# Patient Record
Sex: Male | Born: 1975
Health system: Southern US, Community
[De-identification: ages and names within clinical notes are randomized; demographics above are authoritative.]

## PROBLEM LIST (undated history)

## (undated) DIAGNOSIS — F4310 Post-traumatic stress disorder, unspecified: Secondary | ICD-10-CM

## (undated) DIAGNOSIS — K219 Gastro-esophageal reflux disease without esophagitis: Secondary | ICD-10-CM

## (undated) HISTORY — DX: Post-traumatic stress disorder, unspecified: F43.10

## (undated) HISTORY — DX: Gastro-esophageal reflux disease without esophagitis: K21.9

---

## 2002-02-03 ENCOUNTER — Encounter: Payer: Self-pay | Admitting: *Deleted

## 2002-02-04 ENCOUNTER — Encounter: Payer: Self-pay | Admitting: *Deleted

## 2002-02-04 ENCOUNTER — Inpatient Hospital Stay (HOSPITAL_COMMUNITY): Admission: EM | Admit: 2002-02-04 | Discharge: 2002-02-06 | Payer: Self-pay | Admitting: Emergency Medicine

## 2003-05-25 HISTORY — PX: CARDIAC CATHETERIZATION: SHX172

## 2007-04-25 ENCOUNTER — Ambulatory Visit: Payer: Self-pay | Admitting: Family Medicine

## 2010-07-14 ENCOUNTER — Emergency Department: Payer: Self-pay | Admitting: Emergency Medicine

## 2011-02-03 ENCOUNTER — Ambulatory Visit: Payer: Self-pay | Admitting: General Practice

## 2011-02-15 ENCOUNTER — Encounter: Payer: Self-pay | Admitting: General Practice

## 2011-02-22 ENCOUNTER — Encounter: Payer: Self-pay | Admitting: General Practice

## 2011-03-25 ENCOUNTER — Encounter: Payer: Self-pay | Admitting: General Practice

## 2014-01-09 ENCOUNTER — Ambulatory Visit: Payer: Self-pay | Admitting: General Practice

## 2014-01-14 ENCOUNTER — Ambulatory Visit: Payer: Self-pay | Admitting: General Practice

## 2015-07-15 ENCOUNTER — Ambulatory Visit (INDEPENDENT_AMBULATORY_CARE_PROVIDER_SITE_OTHER): Payer: 59 | Admitting: Family Medicine

## 2015-07-15 ENCOUNTER — Encounter: Payer: Self-pay | Admitting: Family Medicine

## 2015-07-15 VITALS — BP 120/80 | HR 88 | Ht 76.0 in | Wt 218.0 lb

## 2015-07-15 DIAGNOSIS — F4310 Post-traumatic stress disorder, unspecified: Secondary | ICD-10-CM | POA: Diagnosis not present

## 2015-07-15 MED ORDER — TRAZODONE HCL 50 MG PO TABS: 25.0000 mg | ORAL_TABLET | Freq: Every evening | ORAL | Status: DC | PRN

## 2015-07-15 MED ORDER — SERTRALINE HCL 50 MG PO TABS: 50.0000 mg | ORAL_TABLET | Freq: Every day | ORAL | Status: DC

## 2015-07-15 MED ORDER — SERTRALINE HCL 100 MG PO TABS: 100.0000 mg | ORAL_TABLET | Freq: Every day | ORAL | Status: DC

## 2015-07-15 NOTE — Progress Notes (Signed)
Name: Dylan Shields   MRN: [id number]    DOB: February 04, 1976   Date:07/15/2015       Progress Note  Subjective  Chief Complaint  Chief Complaint  Patient presents with  . Establish Care  . Depression    needs to get back in with psychiatry    Depression        This is a recurrent problem.  The current episode started more than 1 year ago.   The onset quality is gradual.   The problem occurs daily.  The problem has been gradually worsening since onset.  Associated symptoms include helplessness, insomnia, irritable, decreased interest and sad.  Associated symptoms include no decreased concentration, no fatigue, no hopelessness, no myalgias, no headaches and no suicidal ideas.     The symptoms are aggravated by work stress.  Past treatments include SSRIs - Selective serotonin reuptake inhibitors.  Compliance with prior treatments: off meds.  Past compliance problems include difficulty with treatment plan.  Previous treatment provided moderate relief.  Risk factors include the patient not taking medications correctly.    No problem-specific assessment & plan notes found for this encounter.   Past Medical History  Diagnosis Date  . GERD (gastroesophageal reflux disease)   . Post traumatic stress disorder (PTSD)     Past Surgical History  Procedure Laterality Date  . Cardiac catheterization  2005    clear    Family History  Problem Relation Age of Onset  . Diabetes Father   . Hyperlipidemia Father   . Hypertension Father   . Heart disease Father     Social History   Social History  . Marital Status: Married    Spouse Name: N/A  . Number of Children: N/A  . Years of Education: N/A   Occupational History  . Not on file.   Social History Main Topics  . Smoking status: Never Smoker   . Smokeless tobacco: Current User    Types: Snuff  . Alcohol Use: 0.0 oz/week    0 Standard drinks or equivalent per week  . Drug Use: No  . Sexual Activity: Yes   Other Topics  Concern  . Not on file   Social History Narrative  . No narrative on file    Allergies  Allergen Reactions  . Penicillin G Other (See Comments)    During infancy     Review of Systems  Constitutional: Negative for fever, chills, weight loss, malaise/fatigue and fatigue.  HENT: Negative for ear discharge, ear pain and sore throat.   Eyes: Negative for blurred vision.  Respiratory: Negative for cough, sputum production, shortness of breath and wheezing.   Cardiovascular: Negative for chest pain, palpitations and leg swelling.  Gastrointestinal: Negative for heartburn, nausea, abdominal pain, diarrhea, constipation, blood in stool and melena.  Genitourinary: Negative for dysuria, urgency, frequency and hematuria.  Musculoskeletal: Negative for myalgias, back pain, joint pain and neck pain.  Skin: Negative for rash.  Neurological: Negative for dizziness, tingling, sensory change, focal weakness and headaches.  Endo/Heme/Allergies: Negative for environmental allergies and polydipsia. Does not bruise/bleed easily.  Psychiatric/Behavioral: Positive for depression. Negative for suicidal ideas and decreased concentration. The patient has insomnia. The patient is not nervous/anxious.      Objective  Filed Vitals:   07/15/15 1436  BP: 120/80  Pulse: 88  Height:  (1.93 m)  Weight: 218 lb (98.884 kg)    Physical Exam  Constitutional: He is oriented to person, place, and time and well-developed, well-nourished, and in  no distress. He is irritable.  HENT:  Head: Normocephalic.  Right Ear: External ear normal.  Left Ear: External ear normal.  Nose: Nose normal.  Mouth/Throat: Oropharynx is clear and moist.  Eyes: Conjunctivae and EOM are normal. Pupils are equal, round, and reactive to light. Right eye exhibits no discharge. Left eye exhibits no discharge. No scleral icterus.  Neck: Normal range of motion. Neck supple. No JVD present. No tracheal deviation present. No  thyromegaly present.  Cardiovascular: Normal rate, regular rhythm, normal heart sounds and intact distal pulses.  Exam reveals no gallop and no friction rub.   No murmur heard. Pulmonary/Chest: Breath sounds normal. No respiratory distress. He has no wheezes. He has no rales.  Abdominal: Soft. Bowel sounds are normal. He exhibits no mass. There is no hepatosplenomegaly. There is no tenderness. There is no rebound, no guarding and no CVA tenderness.  Musculoskeletal: Normal range of motion. He exhibits no edema or tenderness.  Lymphadenopathy:    He has no cervical adenopathy.  Neurological: He is alert and oriented to person, place, and time. He has normal sensation, normal strength, normal reflexes and intact cranial nerves. No cranial nerve deficit.  Skin: Skin is warm. No rash noted.  Psychiatric: Mood and affect normal.  Nursing note and vitals reviewed.     Assessment & Plan  Problem List Items Addressed This Visit    None    Visit Diagnoses    Post traumatic stress disorder (PTSD)    -  Primary    Relevant Medications    traZODone (DESYREL) 50 MG tablet    sertraline (ZOLOFT) 50 MG tablet    sertraline (ZOLOFT) 100 MG tablet    Other Relevant Orders    Ambulatory referral to Psychiatry         Dr. Elizabeth Sauer [facility]  07/15/2015

## 2015-07-24 ENCOUNTER — Other Ambulatory Visit: Payer: Self-pay

## 2015-08-12 ENCOUNTER — Ambulatory Visit: Payer: 59 | Admitting: Family Medicine

## 2015-09-01 ENCOUNTER — Other Ambulatory Visit: Payer: Self-pay

## 2015-09-11 ENCOUNTER — Other Ambulatory Visit: Payer: Self-pay | Admitting: Family Medicine

## 2015-11-18 IMAGING — CR DG ABDOMEN 3V
1 series · 3 of 3 positions shown · non-contrast
Comparison: None.

CLINICAL DATA: Abdominal pain

EXAM:
ABDOMEN SERIES

[Series 1: kdxr abdomen 3-way(inc pa cxr) · 0.14mm/px · 3 of 3 slices shown]
[im 1/3]
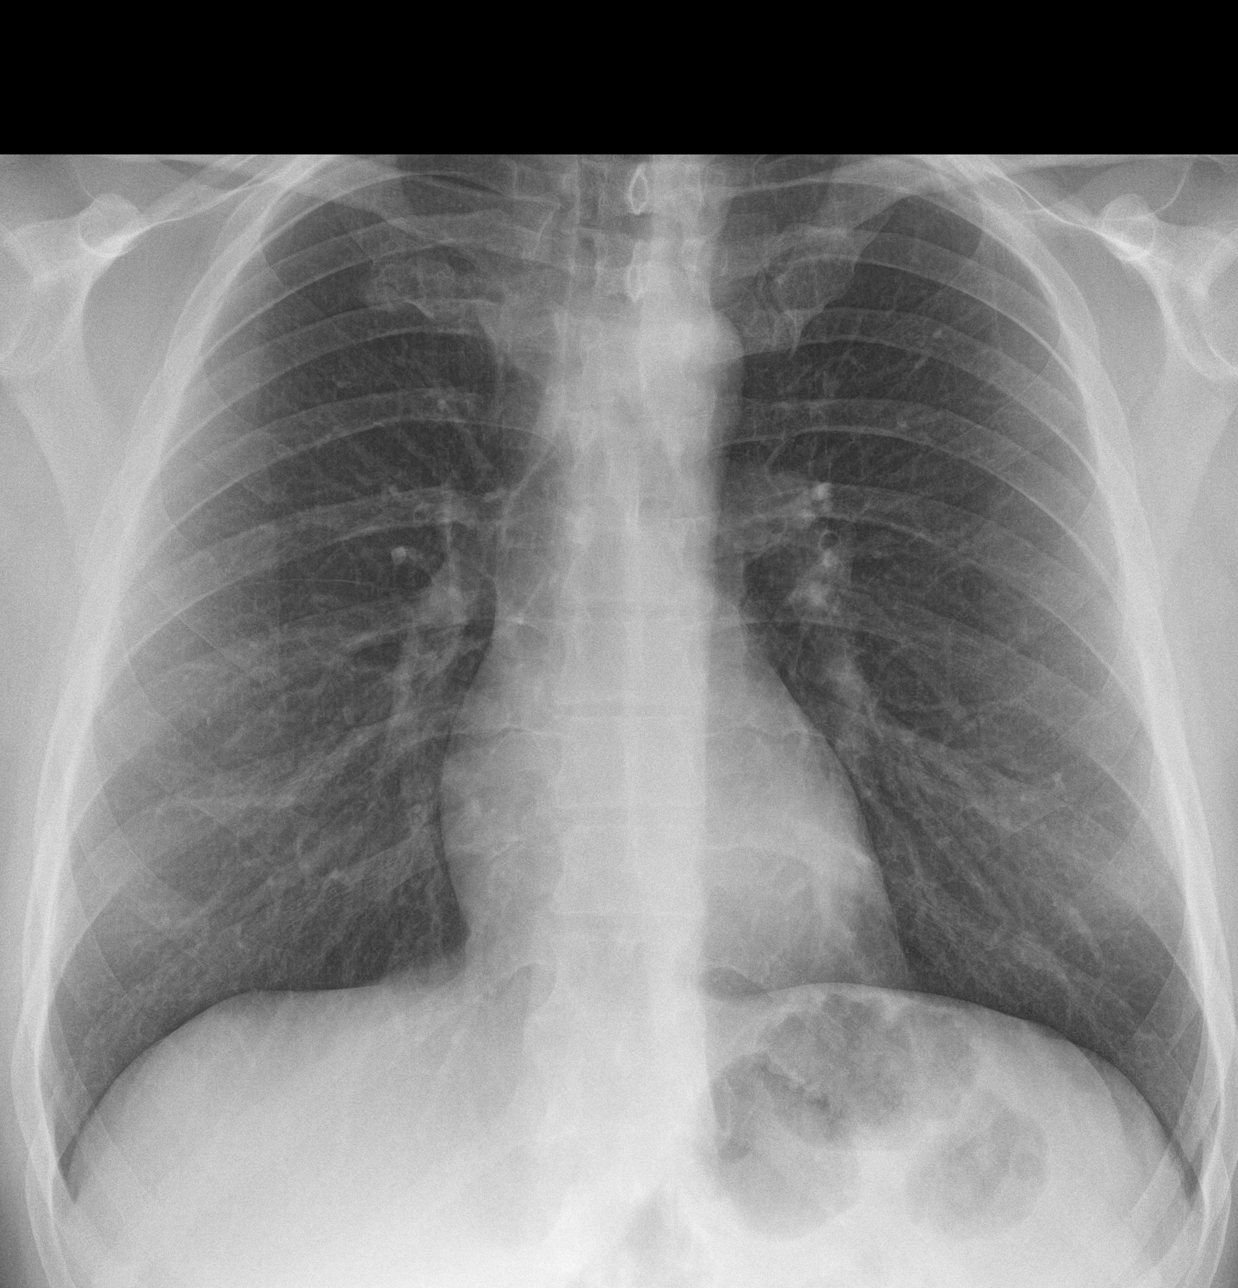
[im 2/3]
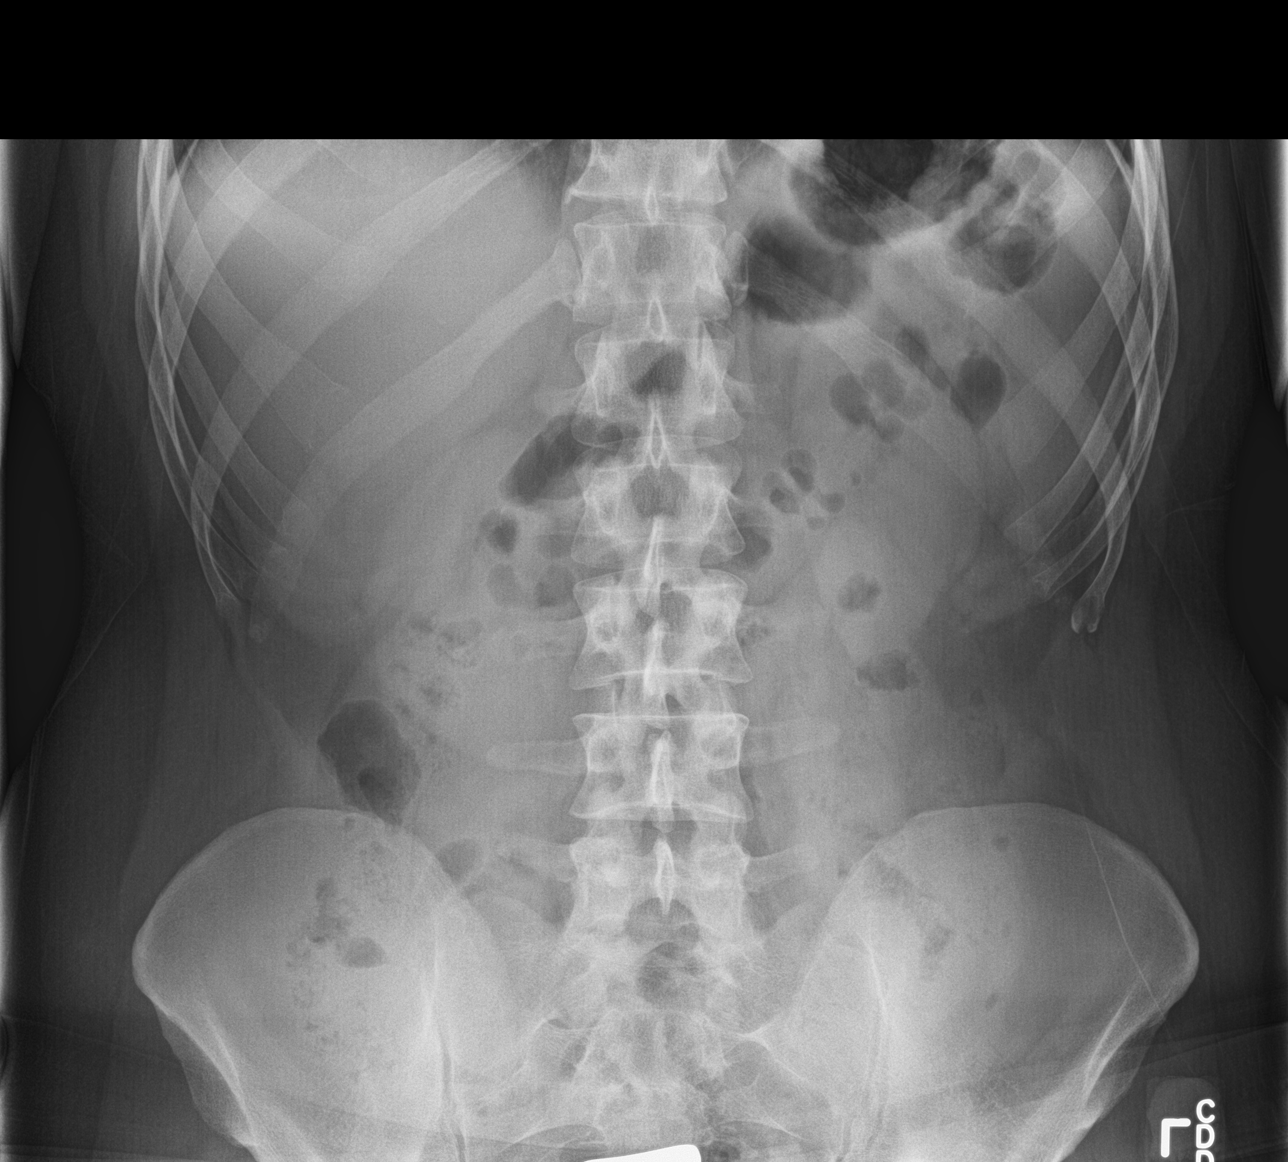
[im 3/3]
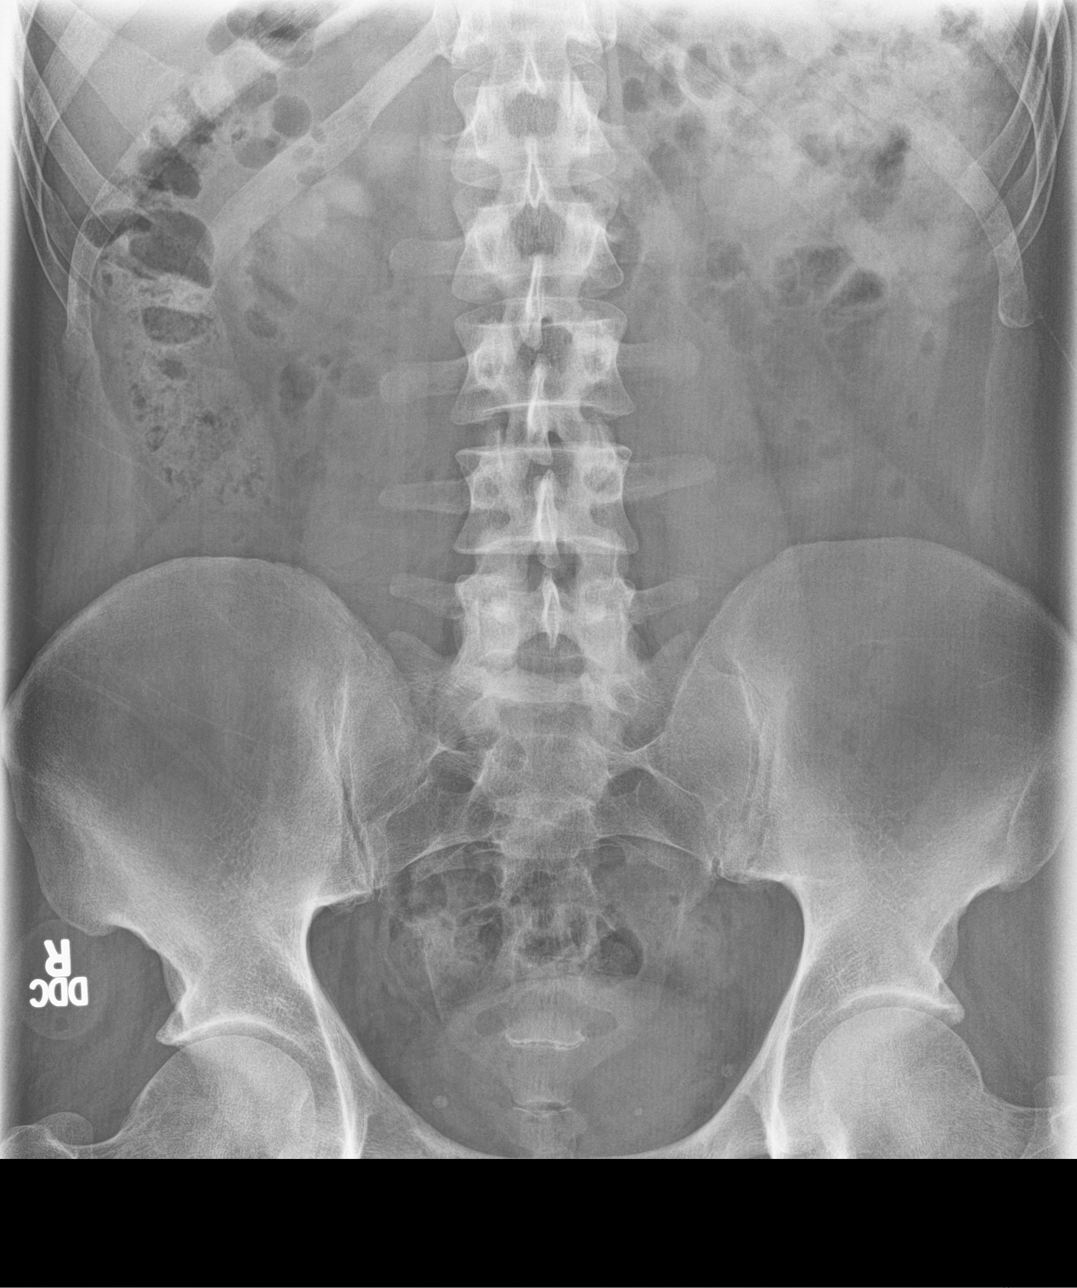

[3 of 3 positions shown; findings below may reference images not displayed]

FINDINGS: PA chest: No edema or consolidation. Heart size and pulmonary
vascularity are normal. No adenopathy.

Supine and upright abdomen: There is moderate stool in the colon.
The bowel gas pattern is unremarkable. No obstruction or free air.
There are phleboliths in the pelvis.
IMPRESSION: Bowel gas pattern unremarkable. Moderate stool in colon. No lung
edema or consolidation.

## 2015-11-23 IMAGING — CT CT ABD-PELV W/ CM
2 of 4 series · 16 of 46 positions shown, 18 images · IV contrast (isovue)
Comparison: Plain films of 01/09/14

CLINICAL DATA: Right upper quadrant and umbilical pain for 3 weeks.
Change in stools. Nausea.

EXAM:
CT ABDOMEN AND PELVIS WITH CONTRAST
TECHNIQUE: Multidetector CT imaging of the abdomen and pelvis was performed
using the standard protocol following bolus administration of
intravenous contrast.
CONTRAST:  100 cc Isovue 370

[Series 2: routine abd pel with · axial · 0.75mm/px · z∈[-961,-516]mm · 13 of 99 slices shown, 15 images]
[im 5/99  soft-tissue]
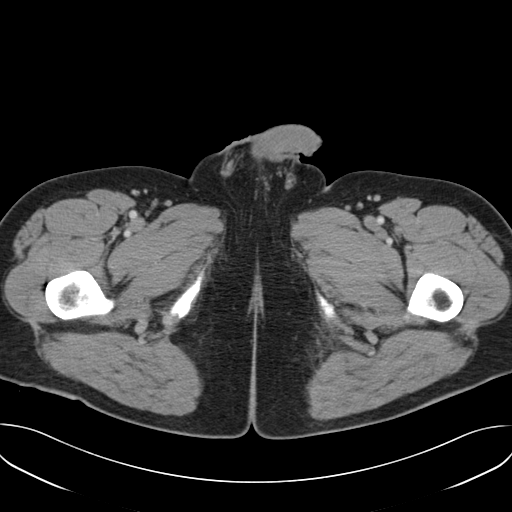
[im 5/99  bone]
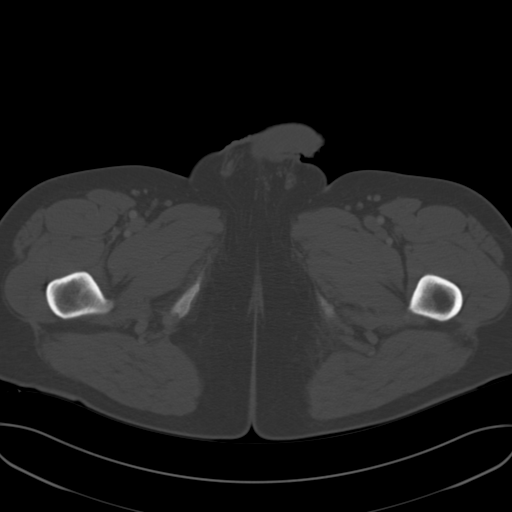
[im 13/99  soft-tissue]
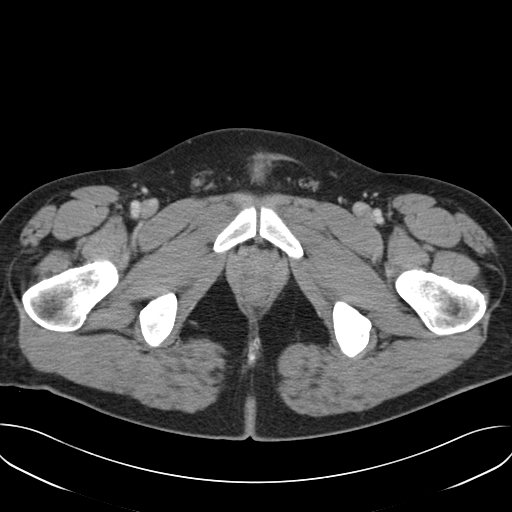
[im 21/99  soft-tissue]
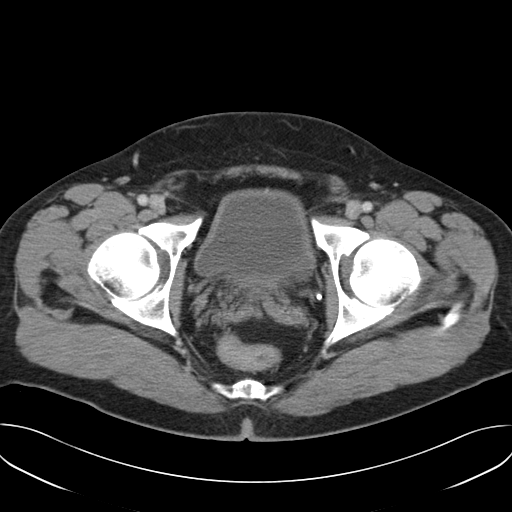
[im 29/99  soft-tissue]
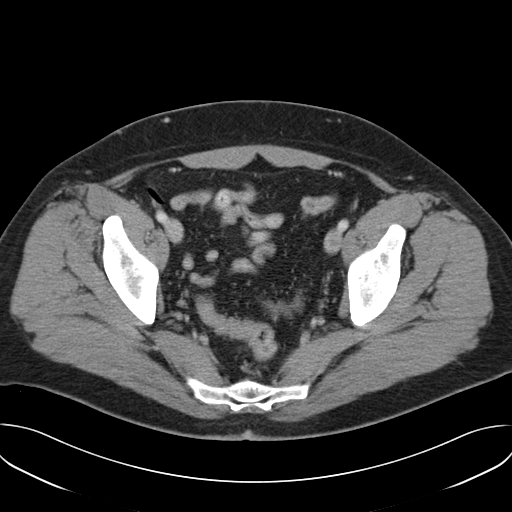
[im 33/99  soft-tissue]
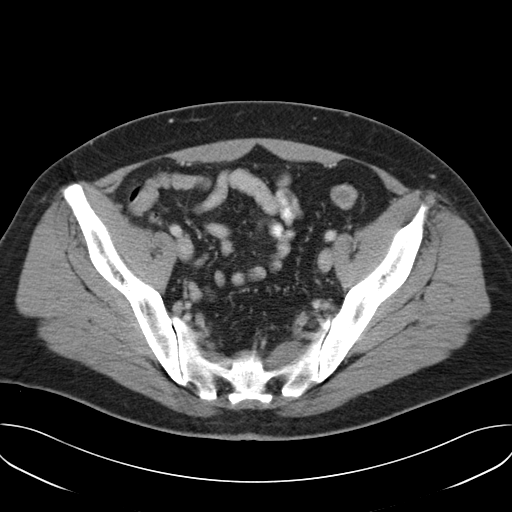
[im 41/99  soft-tissue]
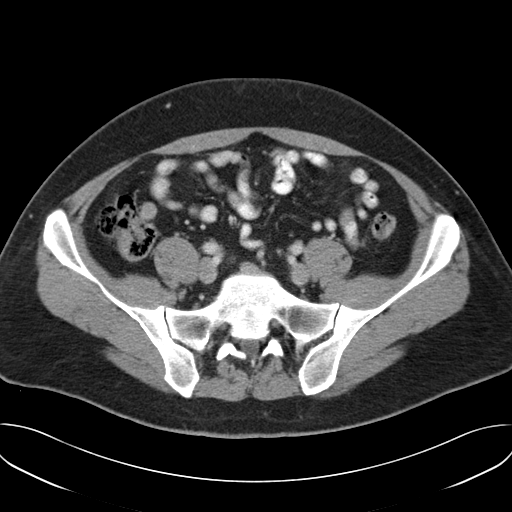
[im 50/99  soft-tissue]
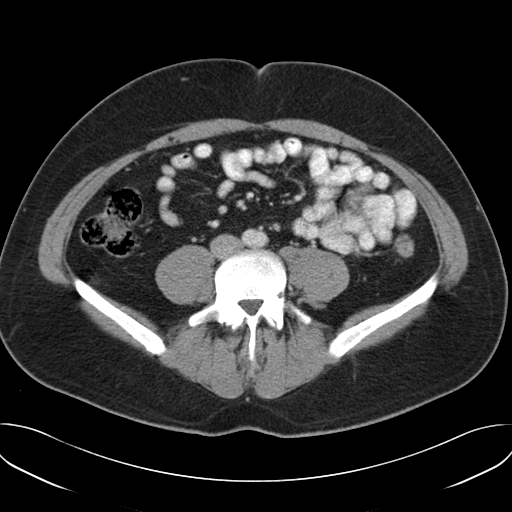
[im 58/99  soft-tissue]
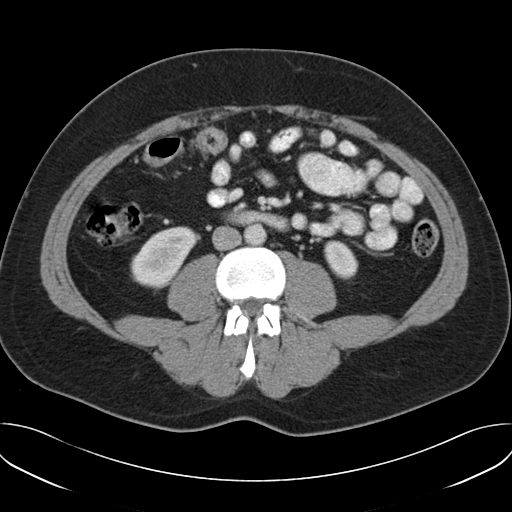
[im 66/99  soft-tissue]
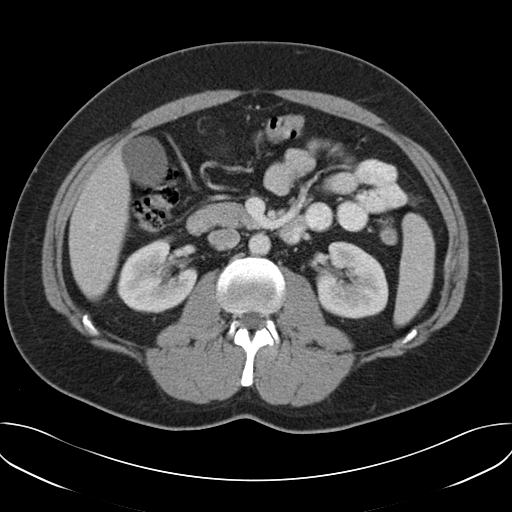
[im 66/99  bone]
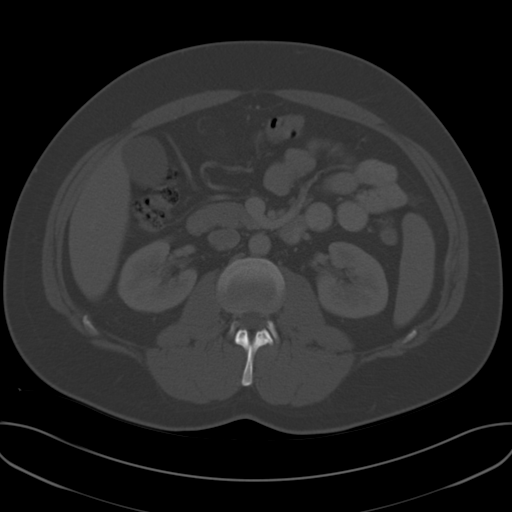
[im 70/99  soft-tissue]
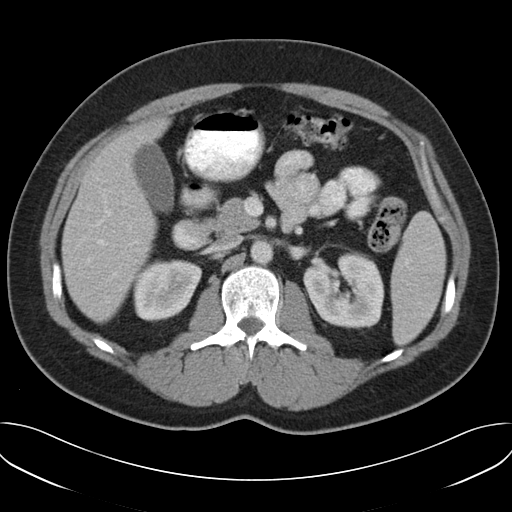
[im 78/99  soft-tissue]
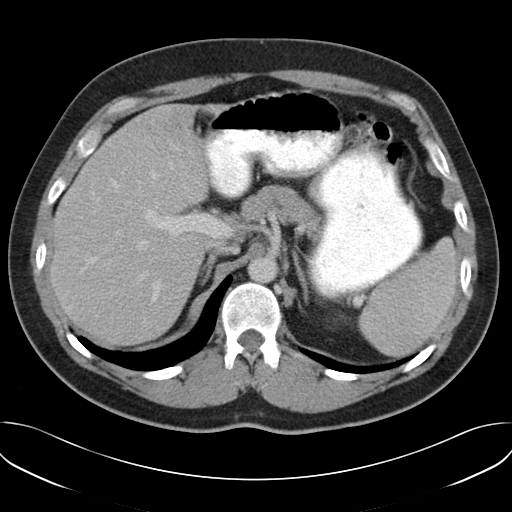
[im 86/99  soft-tissue]
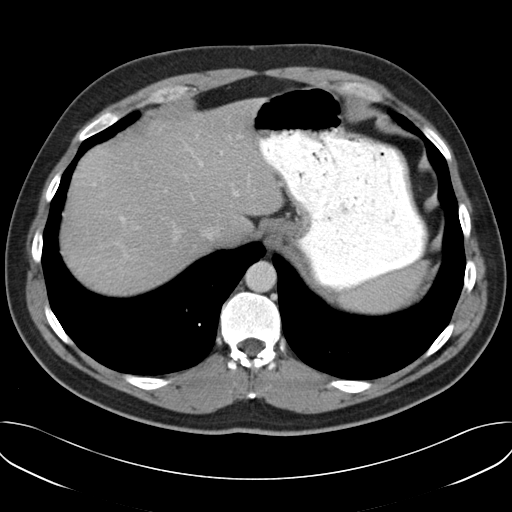
[im 94/99  soft-tissue]
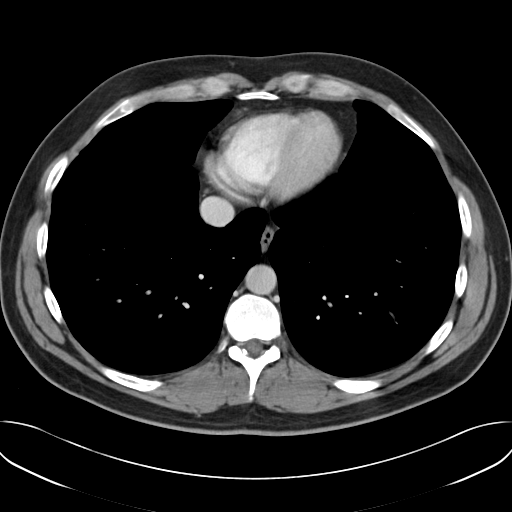

[Series 5: cor routine abd pel with · coronal · 0.77mm/px · 3 of 140 slices shown]
[im 47/140  soft-tissue]
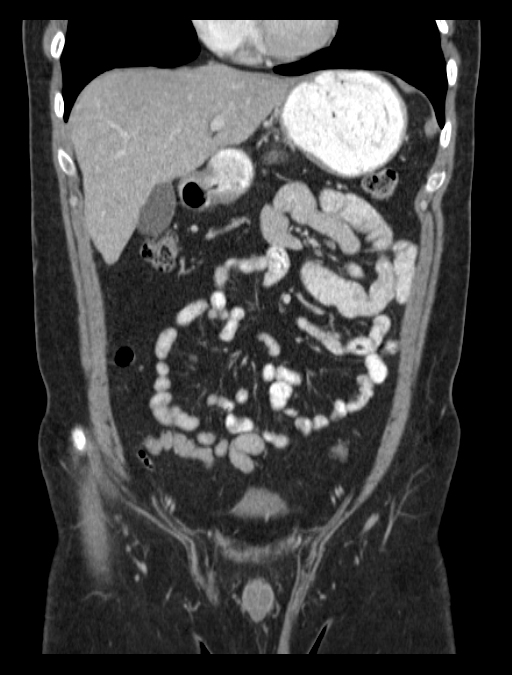
[im 62/140  soft-tissue]
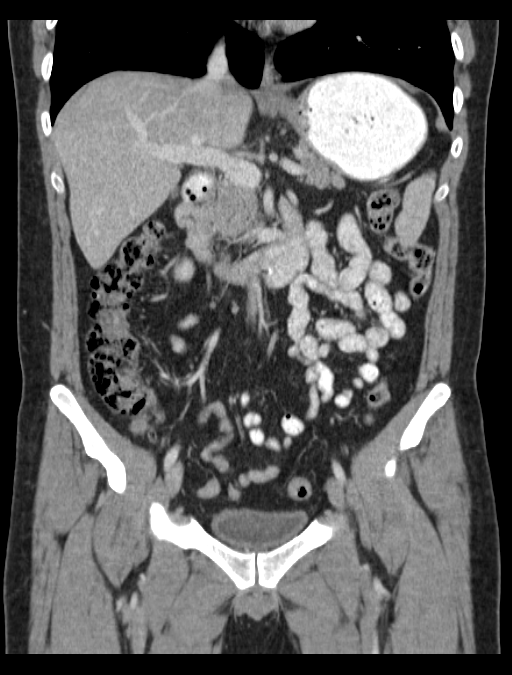
[im 78/140  soft-tissue]
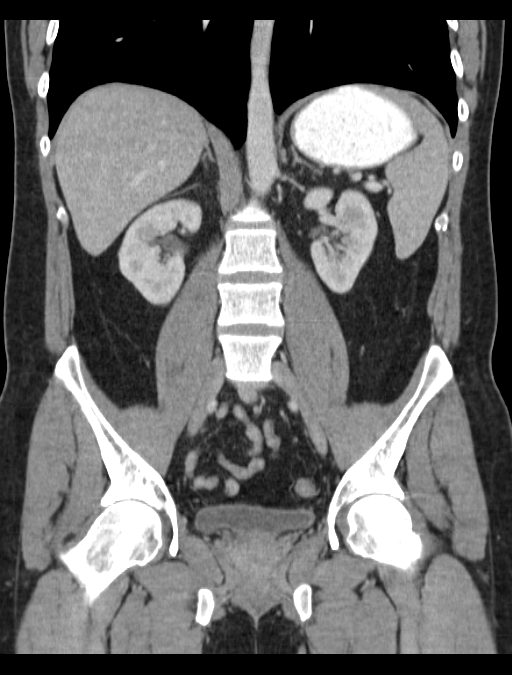

[16 of 46 positions shown; findings below may reference images not displayed]

FINDINGS: Lower chest: Clear lung bases. Normal heart size, without
pericardial or pleural effusion.

Liver: Focal steatosis about the falciform ligament. Suspect diffuse
mild hepatic steatosis.

Spleen: Normal

Stomach: Normal, without wall thickening.

Pancreas: Normal, without mass or pancreatic ductal dilatation.

Gallbladder/Biliary Tree: Normal gallbladder, without intra or
extrahepatic biliary ductal dilatation.

Kidneys/Adrenals: Normal adrenal glands. Normal kidneys, without
hydronephrosis.

Bowel loops: Normal colon, appendix, and terminal ileum. Normal
small bowel without abdominal ascites.

Vascular: Normal

Nodes: No retroperitoneal or retrocrural adenopathy. No pelvic
adenopathy.

Pelvic Genitourinary: Normal urinary bladder and prostate. No
significant free fluid.

Other: None

Bones/Musculoskeletal: No focal osseous abnormality.
IMPRESSION: 1.  No acute process or explanation for abdominal pain.
2. Suspicion of mild hepatic steatosis with more focal steatosis
adjacent the falciform ligament.

## 2016-08-01 ENCOUNTER — Encounter: Payer: Self-pay | Admitting: Emergency Medicine

## 2016-08-01 ENCOUNTER — Emergency Department
Admission: EM | Admit: 2016-08-01 | Discharge: 2016-08-03 | Disposition: A | Discharge status: Other Acute Inpatient Hospital | Payer: Self-pay | Attending: Emergency Medicine | Admitting: Emergency Medicine

## 2016-08-01 DIAGNOSIS — Z79899 Other long term (current) drug therapy: Secondary | ICD-10-CM | POA: Insufficient documentation

## 2016-08-01 DIAGNOSIS — F332 Major depressive disorder, recurrent severe without psychotic features: Secondary | ICD-10-CM | POA: Insufficient documentation

## 2016-08-01 DIAGNOSIS — F17290 Nicotine dependence, other tobacco product, uncomplicated: Secondary | ICD-10-CM | POA: Insufficient documentation

## 2016-08-01 DIAGNOSIS — F10129 Alcohol abuse with intoxication, unspecified: Secondary | ICD-10-CM | POA: Insufficient documentation

## 2016-08-01 DIAGNOSIS — F10929 Alcohol use, unspecified with intoxication, unspecified: Secondary | ICD-10-CM

## 2016-08-01 DIAGNOSIS — R45851 Suicidal ideations: Secondary | ICD-10-CM

## 2016-08-01 LAB — COMPREHENSIVE METABOLIC PANEL
ALBUMIN: 4.6 g/dL (ref 3.5–5.0)
ALT: 18 U/L (ref 17–63)
AST: 25 U/L (ref 15–41)
Alkaline Phosphatase: 73 U/L (ref 38–126)
Anion gap: 9 (ref 5–15)
BUN: 13 mg/dL (ref 6–20)
CHLORIDE: 110 mmol/L (ref 101–111)
CO2: 22 mmol/L (ref 22–32)
CREATININE: 0.85 mg/dL (ref 0.61–1.24)
Calcium: 9 mg/dL (ref 8.9–10.3)
GFR calc Af Amer: >60 mL/min (ref >60)
GFR calc non Af Amer: >60 mL/min (ref >60)
Glucose, Bld: 91 mg/dL (ref 65–99)
POTASSIUM: 3.9 mmol/L (ref 3.5–5.1)
SODIUM: 141 mmol/L (ref 135–145)
Total Bilirubin: 0.6 mg/dL (ref 0.3–1.2)
Total Protein: 7.6 g/dL (ref 6.5–8.1)

## 2016-08-01 LAB — CBC
HCT: 41.1 % (ref 40.0–52.0)
HEMOGLOBIN: 14.4 g/dL (ref 13.0–18.0)
MCH: 30.9 pg (ref 26.0–34.0)
MCHC: 34.9 g/dL (ref 32.0–36.0)
MCV: 88.6 fL (ref 80.0–100.0)
Platelets: 238 10*3/uL (ref 150–440)
RBC: 4.64 MIL/uL (ref 4.40–5.90)
RDW: 13.5 % (ref 11.5–14.5)
WBC: 7.2 10*3/uL (ref 3.8–10.6)

## 2016-08-01 LAB — URINE DRUG SCREEN, QUALITATIVE (ARMC ONLY)
Amphetamines, Ur Screen: NOT DETECTED
BARBITURATES, UR SCREEN: NOT DETECTED
BENZODIAZEPINE, UR SCRN: NOT DETECTED
Cannabinoid 50 Ng, Ur ~~LOC~~: NOT DETECTED
Cocaine Metabolite,Ur ~~LOC~~: NOT DETECTED
MDMA (Ecstasy)Ur Screen: NOT DETECTED
METHADONE SCREEN, URINE: NOT DETECTED
Opiate, Ur Screen: NOT DETECTED
Phencyclidine (PCP) Ur S: NOT DETECTED
TRICYCLIC, UR SCREEN: NOT DETECTED

## 2016-08-01 LAB — ACETAMINOPHEN LEVEL: Acetaminophen (Tylenol), Serum: <10 ug/mL — ABNORMAL LOW (ref 10–30)

## 2016-08-01 LAB — SALICYLATE LEVEL: Salicylate Lvl: <7 mg/dL (ref 2.8–30.0)

## 2016-08-01 LAB — ETHANOL: Alcohol, Ethyl (B): 214 mg/dL — ABNORMAL HIGH (ref <5)

## 2016-08-01 MED ORDER — SERTRALINE HCL 100 MG PO TABS
100.0000 mg | ORAL_TABLET | Freq: Every day | ORAL | Status: DC
Start: 2016-08-01 — End: 2016-08-03
  Administered 2016-08-01 – 2016-08-03 (×3): 100 mg via ORAL
  Filled 2016-08-01 (×2): qty 1
  Filled 2016-08-01: qty 2

## 2016-08-01 MED ORDER — TRAZODONE HCL 50 MG PO TABS
50.0000 mg | ORAL_TABLET | Freq: Every day | ORAL | Status: DC
  Administered 2016-08-01 – 2016-08-02 (×2): 50 mg via ORAL
  Filled 2016-08-01 (×2): qty 1

## 2016-08-01 NOTE — ED Notes (Signed)
Pt transferred from main ed on ivc awaiting in patient adm , he is in no distress and offers no requests or c/o at this time

## 2016-08-01 NOTE — ED Notes (Addendum)
Pt to bhu2. Dr aware

## 2016-08-01 NOTE — ED Notes (Signed)
Pt reports increasing sx of depression over the last few weeks. Pt reports increasing thoughts of self harm. Pt repots he and his wife split up several days ago and he is living with his parents. Pt contracts for safety with this RN at this time.

## 2016-08-01 NOTE — ED Provider Notes (Signed)
[facility] Emergency Department Provider Note  ____________________________________________  Time seen: Approximately 4:48 AM  I have reviewed the triage vital signs and the nursing notes.   HISTORY  Chief Complaint Suicidal    HPI Dylan Shields is a 41 y.o. male with a history of PTSD and depression, currently on Zoloft and trazodone, reporting compliance with medications, states he is feeling suicidal. He wants to kill himself and his wife. 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 States that he feels like his medications aren't working. Also a month ago his father-in-law died and since then he feels like his wife has been very short tempered and he has not been able to interact with her in any positive way. States he is tired of living and just wants it to be over. Denies hallucinations.  No acute medical complaints.     Past Medical History:  Diagnosis Date  . GERD (gastroesophageal reflux disease)   . Post traumatic stress disorder (PTSD)      There are no active problems to display for this patient.    Past Surgical History:  Procedure Laterality Date  . CARDIAC CATHETERIZATION  2005   clear     Prior to Admission medications   Medication Sig Start Date End Date Taking? Authorizing Provider  Multiple Vitamin (MULTIVITAMIN) capsule Take 1 capsule by mouth daily.   Yes Historical Provider, MD  sertraline (ZOLOFT) 100 MG tablet Take 1 tablet (100 mg total) by mouth daily. Taken 150mg  total 07/15/15  Yes Duanne Limerick, MD  sertraline (ZOLOFT) 50 MG tablet Take 1 tablet (50 mg total) by mouth daily. 07/15/15  Yes Duanne Limerick, MD  traZODone (DESYREL) 50 MG tablet Take 0.5-1 tablets (25-50 mg total) by mouth at bedtime as needed for sleep. 07/15/15  Yes Duanne Limerick, MD     Allergies Penicillin g   Family History  Problem Relation Age of Onset  . Diabetes Father   . Hyperlipidemia  Father   . Hypertension Father   . Heart disease Father     Social History Social History  Substance Use Topics  . Smoking status: Never Smoker  . Smokeless tobacco: Current User    Types: Snuff  . Alcohol use 0.0 oz/week    Review of Systems  Constitutional:   No fever or chills.  ENT:   No sore throat. No rhinorrhea. Cardiovascular:   No chest pain. Respiratory:   No dyspnea or cough. Gastrointestinal:   Negative for abdominal pain, vomiting and diarrhea.  Genitourinary:   Negative for dysuria or difficulty urinating. Musculoskeletal:   Negative for focal pain or swelling Neurological:   Negative for headaches 10-point ROS otherwise negative.  ____________________________________________   PHYSICAL EXAM:  VITAL SIGNS: ED Triage Vitals  Enc Vitals Group     BP 08/01/16 0319 137/84     Pulse Rate 08/01/16 0319 (!) 102     Resp 08/01/16 0319 18     Temp 08/01/16 0319 98.1 F (36.7 C)     Temp Source 08/01/16 0319 Oral     SpO2 08/01/16 0319 97 %     Weight 08/01/16 0318 205 lb (93 kg)     Height 08/01/16 0318 6\' 4"  (1.93 m)     Head Circumference --      Peak Flow --      Pain Score 08/01/16 0317 0     Pain Loc --  Pain Edu? --      Excl. in GC? --     Vital signs reviewed, nursing assessments reviewed.   Constitutional:   Alert and oriented. Well appearing and in no distress. Eyes:   No scleral icterus. No conjunctival pallor. PERRL. EOMI.  No nystagmus. ENT   Head:   Normocephalic and atraumatic.   Nose:   No congestion/rhinnorhea. No septal hematoma   Mouth/Throat:   MMM, no pharyngeal erythema. No peritonsillar mass.    Neck:   No stridor. No SubQ emphysema. No meningismus. Hematological/Lymphatic/Immunilogical:   No cervical lymphadenopathy. Cardiovascular:   RRR. Symmetric bilateral radial and DP pulses.  No murmurs.  Respiratory:   Normal respiratory effort without tachypnea nor retractions. Breath sounds are clear and equal  bilaterally. No wheezes/rales/rhonchi. Gastrointestinal:   Soft and nontender. Non distended. There is no CVA tenderness.  No rebound, rigidity, or guarding. Genitourinary:   deferred Musculoskeletal:   Normal range of motion in all extremities. No joint effusions.  No lower extremity tenderness.  No edema. Neurologic:   Normal speech and language.  CN 2-10 normal. Motor grossly intact. No gross focal neurologic deficits are appreciated.  Skin:    Skin is warm, dry and intact. No rash noted.  No petechiae, purpura, or bullae.  ____________________________________________    LABS (pertinent positives/negatives) (all labs ordered are listed, but only abnormal results are displayed) Labs Reviewed  ETHANOL - Abnormal; Notable for the following:       Result Value   Alcohol, Ethyl (B) 214 (*)    All other components within normal limits  ACETAMINOPHEN LEVEL - Abnormal; Notable for the following:    Acetaminophen (Tylenol), Serum <10 (*)    All other components within normal limits  COMPREHENSIVE METABOLIC PANEL  SALICYLATE LEVEL  CBC  URINE DRUG SCREEN, QUALITATIVE ([facility] ONLY)   ____________________________________________   EKG    ____________________________________________    RADIOLOGY  No results found.  ____________________________________________   PROCEDURES Procedures  ____________________________________________   INITIAL IMPRESSION / ASSESSMENT AND PLAN / ED COURSE  Pertinent labs & imaging results that were available during my care of the patient were reviewed by me and considered in my medical decision making (see chart for details).  Patient well appearing no acute distress, presents with SI and HI. Patient is comfortable with guns and has lots of them in his house according to his report. Appears to be decompensated depression. Have initiated involuntary commitment petition. We'll retain the patient in the ED pending psychiatric evaluation. I expect  the patient will be planned for inpatient management as he appears to be high risk for harm to himself or others.         ____________________________________________   FINAL CLINICAL IMPRESSION(S) / ED DIAGNOSES  Final diagnoses:  Alcoholic intoxication with complication (HCC)  Suicidal ideation  Severe episode of recurrent major depressive disorder, without psychotic features (HCC)      New Prescriptions   No medications on file     Portions of this note were generated with dragon dictation software. Dictation errors may occur despite best attempts at proofreading.    Sharman CheekPhillip Jaidon Ellery, MD 08/01/16 [phone]

## 2016-08-01 NOTE — ED Notes (Signed)
PT IVC/ PENDING PLACEMENT  

## 2016-08-01 NOTE — ED Notes (Signed)
Pt is taking a shower. Linens changed and trash removed from room. 

## 2016-08-01 NOTE — BH Assessment (Signed)
Referral information for Psychiatric Hospitalization faxed to;   [facility] ([phone] or [phone])  - Per Sharon they do have open beds.  Writer faxed referral to [phone]  [facility] ([phone]), - Per Amanda, they do have open beds.  Writer faxed referral to  [phone]   [facility] ([phone]), -  Per Noel there are open beds.  Writer faxed referral to [phone]  [facility] ([phone]) - Per Shannon, they have one adult male bed.  Writer faxed referral to: [phone]  [facility] ([phone]), - Per Erica - no open today; however, a referral can be sent due to open beds tomorrow.  Writer faxed referral to [phone]   [facility] ([phone]), - Per Jessica no open today; however, a referral can be sent due to open beds tomorrow. Writer faxed referral to [phone]    The following facilities do not have any open beds [facility] ([phone]), Per [facility] - no open beds. [facility] ([phone]), - Per Jonathan - no open beds.     The following facilities did not answer the phone but referrals were faxed to the facility.   [facility] ([phone] or [phone] - Voicemail - referral faxed.        [facility] ([phone]), - No answer writer faxed referral    [facility] ([phone] or [phone]), - No answer writer faxed referral    [facility] ([phone]), - No answer writer faxed referral    [facility] ([phone]) - No answer writer faxed referral     [facility] ([phone]), - No answer writer faxed referral     

## 2016-08-01 NOTE — ED Notes (Signed)
BEHAVIORAL HEALTH ROUNDING  Patient sleeping: No.  Patient alert and oriented: yes  Behavior appropriate: Yes. ; If no, describe:  Nutrition and fluids offered: Yes  Toileting and hygiene offered: Yes  Sitter present: not applicable, Q 15 min safety rounds and observation.  Law enforcement present: Yes ODS  

## 2016-08-01 NOTE — ED Notes (Signed)

## 2016-08-01 NOTE — ED Notes (Signed)
Report was received from Verdis Fredericksonuth B., RN; Pt. Verbalizes  complaints of having Depression and PTSD; distress; verbalizes having S.I.; denies having Hi. Continue to monitor with 15 min. Monitoring.

## 2016-08-01 NOTE — ED Notes (Signed)
Pt was given a cup of water, RN notified

## 2016-08-01 NOTE — ED Notes (Signed)

## 2016-08-01 NOTE — ED Notes (Signed)
Pt given two warm blankets. Pt cold with no blankets given at arrival. Pt breakfast tray placed at bedside, pt stated "not hungry".

## 2016-08-01 NOTE — BH Assessment (Signed)
Tele Assessment Note   Dylan Shields is an 41 y.o. male. Presenting voluntarily to ED for assessment. Pt transported to ED via law enforcement after voicing SI to mother. Pt has h/o PTSD and depression. Pt reports continued SI with plan, intent, and access to shoot himself. Pt previously employed as Radiographer, therapeutic). Pt reports HI with plan and access to harm wife. Pt does not verbalize plan details but states plan to "get it done". Pt reports thoughts of killing himself and "her (wife) if she tries to stop me and anybody who gets in my way". Pt denies current stressors and identifies his parents as supports. Pt does report concerns with medication effectiveness. Pt denies h/o violence and aggression. Pt denies h/o suicide attempt and inpatient admissions. Pt not currently followed by any psychiatric providers. Pt denies hallucinations. Pt does not appear to be responding to internal stimuli or experiencing delusional thought content.   Diagnosis:PTSD MDD  Past Medical History:  Past Medical History:  Diagnosis Date  . GERD (gastroesophageal reflux disease)   . Post traumatic stress disorder (PTSD)     Past Surgical History:  Procedure Laterality Date  . CARDIAC CATHETERIZATION  2005   clear    Family History:  Family History  Problem Relation Age of Onset  . Diabetes Father   . Hyperlipidemia Father   . Hypertension Father   . Heart disease Father     Social History:  reports that he has never smoked. His smokeless tobacco use includes Snuff. He reports that he drinks alcohol. He reports that he does not use drugs.  Additional Social History:  Alcohol / Drug Use Pain Medications: Pt denies abuse. Pt reports medication compliance however, voices concerns regarding effectiveness. Prescriptions: Pt denies abuse. Over the Counter: Pt denies abuse. History of alcohol / drug use?: No history of alcohol / drug abuse  CIWA: CIWA-Ar BP: 137/84 Pulse Rate: (!)  102 COWS:    PATIENT STRENGTHS: (choose at least two) Average or above average intelligence Communication skills Physical Health  Allergies:  Allergies  Allergen Reactions  . Penicillin G Other (See Comments)    During infancy    Home Medications:  (Not in a hospital admission)  OB/GYN Status:  No LMP for male patient.  General Assessment Data Location of Assessment: [facility] ED TTS Assessment: In system Is this a Tele or Face-to-Face Assessment?: Tele Assessment Is this an Initial Assessment or a Re-assessment for this encounter?: Initial Assessment Marital status: Married Is patient pregnant?: No Pregnancy Status: No Living Arrangements: Spouse/significant other Can pt return to current living arrangement?: Yes Admission Status: Voluntary Is patient capable of signing voluntary admission?: Yes Referral Source: Self/Family/Friend Insurance type: Self-Pay     Crisis Care Plan Living Arrangements: Spouse/significant other Name of Psychiatrist: None Name of Therapist: None  Education Status Is patient currently in school?: No Highest grade of school patient has completed: College  Risk to self with the past 6 months Suicidal Ideation: Yes-Currently Present Has patient been a risk to self within the past 6 months prior to admission? : No Suicidal Intent: Yes-Currently Present Has patient had any suicidal intent within the past 6 months prior to admission? : No Is patient at risk for suicide?: Yes Suicidal Plan?: Yes-Currently Present Has patient had any suicidal plan within the past 6 months prior to admission? : No Specify Current Suicidal Plan: Shoot self Access to Means: Yes Specify Access to Suicidal Means: Access to firearms What has been your use of  drugs/alcohol within the last 12 months?: Pt reports alcohol consumption 1-2x/week Previous Attempts/Gestures: No Other Self Harm Risks: n Intentional Self Injurious Behavior: None Family Suicide History:  No Recent stressful life event(s):  (Pt denies any current stressors) Persecutory voices/beliefs?: No Depression: Yes Depression Symptoms: Insomnia, Tearfulness, Feeling angry/irritable, Feeling worthless/self pity, Fatigue, Isolating, Despondent, Loss of interest in usual pleasures Substance abuse history and/or treatment for substance abuse?: No Suicide prevention information given to non-admitted patients: Not applicable  Risk to Others within the past 6 months Homicidal Ideation: Yes-Currently Present Does patient have any lifetime risk of violence toward others beyond the six months prior to admission? : No Thoughts of Harm to Others: Yes-Currently Present Comment - Thoughts of Harm to Others: Pt reports thougts of harming wife "if she tries to stop me and anyone else who gets in my way" Current Homicidal Intent: No Current Homicidal Plan: Yes-Currently Present Describe Current Homicidal Plan: Pt would not clearly identify  Access to Homicidal Means: Yes Describe Access to Homicidal Means: Pt reports access to firearms History of harm to others?: No Does patient have access to weapons?: Yes (Comment) Criminal Charges Pending?: No Does patient have a court date: No Is patient on probation?: No  Psychosis Hallucinations: None noted Delusions: None noted  Mental Status Report Appearance/Hygiene: In scrubs Eye Contact: Fair Motor Activity: Freedom of movement Speech: Soft Level of Consciousness: Alert Mood: Depressed Anxiety Level: None Thought Processes: Coherent, Relevant Judgement: Unimpaired Orientation: Person, Place, Time, Situation Obsessive Compulsive Thoughts/Behaviors: None  Cognitive Functioning Concentration: Normal Memory: Recent Intact, Remote Intact IQ: Average Insight: Good Impulse Control: Fair Appetite: Fair Weight Loss: 20 (w/in 8 months) Weight Gain: 0 Sleep: No Change Total Hours of Sleep: 3  ADLScreening [facility] Assessment Services) Patient's  cognitive ability adequate to safely complete daily activities?: Yes Patient able to express need for assistance with ADLs?: Yes Independently performs ADLs?: Yes (appropriate for developmental age)  Prior Inpatient Therapy Prior Inpatient Therapy: No  Prior Outpatient Therapy Prior Outpatient Therapy: Yes Prior Therapy Dates: 3 Years ago Prior Therapy Facilty/Provider(s): Dr.Kapur Reason for Treatment: Depression, PTSD Does patient have an ACCT team?: No Does patient have Intensive In-House Services?  : No Does patient have [facility] services? : No Does patient have P4CC services?: No  ADL Screening (condition at time of admission) Patient's cognitive ability adequate to safely complete daily activities?: Yes Is the patient deaf or have difficulty hearing?: No Does the patient have difficulty concentrating, remembering, or making decisions?: No Patient able to express need for assistance with ADLs?: Yes Does the patient have difficulty dressing or bathing?: No Independently performs ADLs?: Yes (appropriate for developmental age) Does the patient have difficulty walking or climbing stairs?: No Weakness of Legs: None Weakness of Arms/Hands: None  Home Assistive Devices/Equipment Home Assistive Devices/Equipment: None  Therapy Consults (therapy consults require a physician order) PT Evaluation Needed: No OT Evalulation Needed: No SLP Evaluation Needed: No Abuse/Neglect Assessment (Assessment to be complete while patient is alone) Physical Abuse: Denies Verbal Abuse: Denies Sexual Abuse: Denies Exploitation of patient/patient's resources: Denies Self-Neglect: Denies Values / Beliefs Cultural Requests During Hospitalization: None Spiritual Requests During Hospitalization: None Consults Spiritual Care Consult Needed: No Social Work Consult Needed: No Merchant navy officer (For Healthcare) Does Patient Have a Medical Advance Directive?: No Would patient like information on  creating a medical advance directive?: No - Patient declined    Additional Information 1:1 In Past 12 Months?: No CIRT Risk: No Elopement Risk: No Does patient have medical clearance?:  No     Disposition:  Disposition Initial Assessment Completed for this Encounter: Yes Disposition of Patient: Other dispositions Other disposition(s): Other (Comment) (Pending psychiatric recommendation)  Dylan Shields 08/01/2016 5:50 AM

## 2016-08-01 NOTE — ED Notes (Signed)
[facility]

## 2016-08-01 NOTE — ED Notes (Signed)
Pt is aware he is waiting for an in pt psych bed ,he is agreeable to that plan

## 2016-08-01 NOTE — ED Triage Notes (Signed)
Pt says he's been battling depression and PTSD for years; about 3 years ago his psychiatrist would not see him anymore; pt says he's tired of living; has thought of shooting himself; pt calm and cooperative at this time; here with [place] police officer; pt says he had 2 beers earlier, denies drugs

## 2016-08-01 NOTE — ED Notes (Signed)
Pt ambulated to bathroom unassisted. Pt given lunch tray

## 2016-08-02 LAB — ETHANOL: Alcohol, Ethyl (B): <5 mg/dL (ref <5)

## 2016-08-02 NOTE — ED Notes (Signed)
BEHAVIORAL HEALTH ROUNDING  Patient sleeping: No.  Patient alert and oriented: yes  Behavior appropriate: Yes. ; If no, describe:  Nutrition and fluids offered: Yes  Toileting and hygiene offered: Yes  Sitter present: not applicable, Q 15 min safety rounds and observation.  Law enforcement present: Yes ODS  

## 2016-08-02 NOTE — ED Notes (Signed)
ENVIRONMENTAL ASSESSMENT  Potentially harmful objects out of patient reach: Yes.  Personal belongings secured: Yes.  Patient dressed in hospital provided attire only: Yes.  Plastic bags out of patient reach: Yes.  Patient care equipment (cords, cables, call bells, lines, and drains) shortened, removed, or accounted for: Yes.  Equipment and supplies removed from bottom of stretcher: Yes.  Potentially toxic materials out of patient reach: Yes.  Sharps container removed or out of patient reach: Yes.  BEHAVIORAL HEALTH ROUNDING  Patient sleeping: No.  Patient alert and oriented: yes  Behavior appropriate: Yes. ; If no, describe:  Nutrition and fluids offered: Yes  Toileting and hygiene offered: Yes  Sitter present: not applicable, Q 15 min safety rounds and observation.  Law enforcement present: Yes ODS  ED BHU PLACEMENT JUSTIFICATION  Is the patient under IVC or is there intent for IVC: Yes.  Is the patient medically cleared: Yes.  Is there vacancy in the ED BHU: Yes.  Is the population mix appropriate for patient: Yes.  Is the patient awaiting placement in inpatient or outpatient setting: Yes.  Has the patient had a psychiatric consult: Yes.  Survey of unit performed for contraband, proper placement and condition of furniture, tampering with fixtures in bathroom, shower, and each patient room: Yes. ; Findings: All clear  APPEARANCE/BEHAVIOR  calm, cooperative and adequate rapport can be established  NEURO ASSESSMENT  Orientation: time, place and person  Hallucinations: No.None noted (Hallucinations)  Speech: Normal  Gait: normal  RESPIRATORY ASSESSMENT  WNL  CARDIOVASCULAR ASSESSMENT  WNL  GASTROINTESTINAL ASSESSMENT  WNL  EXTREMITIES  WNL  PLAN OF CARE  Provide calm/safe environment. Vital signs assessed TID. ED BHU Assessment once each 12-hour shift. Collaborate with TTS daily or as condition indicates. Assure the ED provider has rounded once each shift. Provide and  encourage hygiene. Provide redirection as needed. Assess for escalating behavior; address immediately and inform ED provider.  Assess family dynamic and appropriateness for visitation as needed: Yes. ; If necessary, describe findings:  Educate the patient/family about BHU procedures/visitation: Yes. ; If necessary, describe findings: Pt is calm and cooperative at this time. Pt understanding and accepting of unit procedures/rules. Will continue to monitor with Q 15 min safety rounds and observation via security camera.

## 2016-08-02 NOTE — ED Notes (Signed)
BEHAVIORAL HEALTH ROUNDING Patient sleeping: Yes.   Patient alert and oriented: not applicable SLEEPING Behavior appropriate: Yes.  ; If no, describe: SLEEPING Nutrition and fluids offered: No SLEEPING Toileting and hygiene offered: NoSLEEPING Sitter present: not applicable, Q 15 min safety rounds and observation via security camera. Law enforcement present: Yes ODS 

## 2016-08-02 NOTE — BH Assessment (Signed)
[facility] [employer] [employer](Donna Sholar) requesting updated BAL faxed to [phone]. Pt RN Claris Che(Margaret) informed of request.

## 2016-08-02 NOTE — ED Notes (Signed)
BEHAVIORAL HEALTH ROUNDING  Patient sleeping: No.  Patient alert and oriented: yes  Behavior appropriate: Yes. ; If no, describe:  Nutrition and fluids offered: Yes  Toileting and hygiene offered: Yes  Sitter present: not applicable, Q 15 min safety rounds and observation via security camera. Law enforcement present: Yes ODS  

## 2016-08-02 NOTE — Progress Notes (Signed)
Patient is alert and oriented.  He is sitting up in bed talking on the phone to his wife.  Spoke with patient earlier and he denies any thoughts of self harm; HI or auditory hallucinations.  He was given a meal and his appetite is good.  He currently has a Equ[employer] tradervisitor.  A repeat BAL was ordered per MD and will obtain this afternoon.  Patient appears sober, alert and he is cooperative.  His behavior is appropriate to situation.

## 2016-08-02 NOTE — ED Provider Notes (Signed)
-----------------------------------------   8:05 AM on 08/02/2016 -----------------------------------------   Blood pressure 133/74, pulse 76, temperature 98.3 F (36.8 C), temperature source Oral, resp. rate 17, height 6\' 4"  (1.93 m), weight 205 lb (93 kg), SpO2 98 %.  The patient had no acute events since last update.  Calm and cooperative at this time.  Disposition is pending Psychiatry/Behavioral Medicine team recommendations.     Merrily BrittleNeil Issam Carlyon, MD 08/02/16 [id number]
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
## 2016-08-02 NOTE — ED Notes (Signed)
Pt ivc/ pending placement  

## 2016-08-02 NOTE — BH Assessment (Signed)
[facility] (Jessica-[phone]), patient have a possible bed offer, pending BAC.  Writer informed ER MD (Dr. Lloyd HugerNeil) and he put an ordered in for repeat BAC. Patient nurse Rayfield Citizen(Caroline) is aware of it as well.

## 2016-08-03 NOTE — ED Notes (Signed)
BEHAVIORAL HEALTH ROUNDING Patient sleeping: Yes.   Patient alert and oriented: not applicable SLEEPING Behavior appropriate: Yes.  ; If no, describe: SLEEPING Nutrition and fluids offered: No SLEEPING Toileting and hygiene offered: NoSLEEPING Sitter present: not applicable, Q 15 min safety rounds and observation via security camera. Law enforcement present: Yes ODS 

## 2016-08-03 NOTE — ED Notes (Signed)
BEHAVIORAL HEALTH ROUNDING  Patient sleeping: No.  Patient alert and oriented: yes  Behavior appropriate: Yes. ; If no, describe:  Nutrition and fluids offered: Yes  Toileting and hygiene offered: Yes  Sitter present: not applicable, Q 15 min safety rounds and observation via security camera. Law enforcement present: Yes ODS  

## 2016-08-03 NOTE — ED Notes (Signed)
[place] and [place] [facility] [place], Patient took all belongings, no signs of distress.
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
## 2017-12-16 DIAGNOSIS — K210 Gastro-esophageal reflux disease with esophagitis: Secondary | ICD-10-CM | POA: Diagnosis not present

## 2017-12-16 DIAGNOSIS — R030 Elevated blood-pressure reading, without diagnosis of hypertension: Secondary | ICD-10-CM | POA: Diagnosis not present

## 2018-05-05 DIAGNOSIS — Z0000 Encounter for general adult medical examination without abnormal findings: Secondary | ICD-10-CM | POA: Diagnosis not present

## 2018-05-05 DIAGNOSIS — K210 Gastro-esophageal reflux disease with esophagitis: Secondary | ICD-10-CM | POA: Diagnosis not present

## 2018-05-08 DIAGNOSIS — Z0000 Encounter for general adult medical examination without abnormal findings: Secondary | ICD-10-CM | POA: Diagnosis not present

## 2019-02-27 ENCOUNTER — Encounter: Payer: Self-pay | Admitting: Emergency Medicine

## 2019-02-27 ENCOUNTER — Other Ambulatory Visit: Payer: Self-pay

## 2019-02-27 ENCOUNTER — Observation Stay
Admission: EM | Admit: 2019-02-27 | Discharge: 2019-02-28 | Disposition: A | Discharge status: Home / Self Care | Payer: 59 | Attending: Internal Medicine | Admitting: Internal Medicine

## 2019-02-27 ENCOUNTER — Emergency Department: Payer: 59

## 2019-02-27 DIAGNOSIS — E785 Hyperlipidemia, unspecified: Secondary | ICD-10-CM | POA: Diagnosis not present

## 2019-02-27 DIAGNOSIS — Z79899 Other long term (current) drug therapy: Secondary | ICD-10-CM | POA: Insufficient documentation

## 2019-02-27 DIAGNOSIS — Z7982 Long term (current) use of aspirin: Secondary | ICD-10-CM | POA: Insufficient documentation

## 2019-02-27 DIAGNOSIS — Z20828 Contact with and (suspected) exposure to other viral communicable diseases: Secondary | ICD-10-CM | POA: Diagnosis not present

## 2019-02-27 DIAGNOSIS — Z6829 Body mass index (BMI) 29.0-29.9, adult: Secondary | ICD-10-CM | POA: Insufficient documentation

## 2019-02-27 DIAGNOSIS — I252 Old myocardial infarction: Secondary | ICD-10-CM | POA: Insufficient documentation

## 2019-02-27 DIAGNOSIS — Z8349 Family history of other endocrine, nutritional and metabolic diseases: Secondary | ICD-10-CM | POA: Insufficient documentation

## 2019-02-27 DIAGNOSIS — Z21 Asymptomatic human immunodeficiency virus [HIV] infection status: Secondary | ICD-10-CM | POA: Diagnosis not present

## 2019-02-27 DIAGNOSIS — I10 Essential (primary) hypertension: Secondary | ICD-10-CM | POA: Diagnosis not present

## 2019-02-27 DIAGNOSIS — Z880 Allergy status to penicillin: Secondary | ICD-10-CM | POA: Insufficient documentation

## 2019-02-27 DIAGNOSIS — I2510 Atherosclerotic heart disease of native coronary artery without angina pectoris: Secondary | ICD-10-CM | POA: Insufficient documentation

## 2019-02-27 DIAGNOSIS — Z8249 Family history of ischemic heart disease and other diseases of the circulatory system: Secondary | ICD-10-CM | POA: Diagnosis not present

## 2019-02-27 DIAGNOSIS — F4310 Post-traumatic stress disorder, unspecified: Secondary | ICD-10-CM | POA: Insufficient documentation

## 2019-02-27 DIAGNOSIS — Z720 Tobacco use: Secondary | ICD-10-CM | POA: Insufficient documentation

## 2019-02-27 DIAGNOSIS — E669 Obesity, unspecified: Secondary | ICD-10-CM | POA: Insufficient documentation

## 2019-02-27 DIAGNOSIS — R079 Chest pain, unspecified: Secondary | ICD-10-CM | POA: Diagnosis present

## 2019-02-27 DIAGNOSIS — K219 Gastro-esophageal reflux disease without esophagitis: Secondary | ICD-10-CM | POA: Diagnosis not present

## 2019-02-27 DIAGNOSIS — R0789 Other chest pain: Secondary | ICD-10-CM | POA: Diagnosis not present

## 2019-02-27 DIAGNOSIS — R000 Tachycardia, unspecified: Secondary | ICD-10-CM | POA: Insufficient documentation

## 2019-02-27 LAB — CBC WITH DIFFERENTIAL/PLATELET
Abs Immature Granulocytes: 0.02 10*3/uL (ref 0.00–0.07)
Basophils Absolute: 0.1 10*3/uL (ref 0.0–0.1)
Basophils Relative: 1 %
Eosinophils Absolute: 0.4 10*3/uL (ref 0.0–0.5)
Eosinophils Relative: 5 %
HCT: 43.6 % (ref 39.0–52.0)
Hemoglobin: 15 g/dL (ref 13.0–17.0)
Immature Granulocytes: 0 %
Lymphocytes Relative: 39 %
Lymphs Abs: 3.1 10*3/uL (ref 0.7–4.0)
MCH: 30.1 pg (ref 26.0–34.0)
MCHC: 34.4 g/dL (ref 30.0–36.0)
MCV: 87.6 fL (ref 80.0–100.0)
Monocytes Absolute: 0.6 10*3/uL (ref 0.1–1.0)
Monocytes Relative: 7 %
Neutro Abs: 3.7 10*3/uL (ref 1.7–7.7)
Neutrophils Relative %: 48 %
Platelets: 248 10*3/uL (ref 150–400)
RBC: 4.98 MIL/uL (ref 4.22–5.81)
RDW: 13.2 % (ref 11.5–15.5)
WBC: 7.8 10*3/uL (ref 4.0–10.5)
nRBC: 0 % (ref 0.0–0.2)

## 2019-02-27 LAB — COMPREHENSIVE METABOLIC PANEL
ALT: 40 U/L (ref 0–44)
AST: 30 U/L (ref 15–41)
Albumin: 4.5 g/dL (ref 3.5–5.0)
Alkaline Phosphatase: 90 U/L (ref 38–126)
Anion gap: 13 (ref 5–15)
BUN: 13 mg/dL (ref 6–20)
CO2: 23 mmol/L (ref 22–32)
Calcium: 9 mg/dL (ref 8.9–10.3)
Chloride: 103 mmol/L (ref 98–111)
Creatinine, Ser: 1.29 mg/dL — ABNORMAL HIGH (ref 0.61–1.24)
GFR calc Af Amer: >60 mL/min (ref >60)
GFR calc non Af Amer: >60 mL/min (ref >60)
Glucose, Bld: 106 mg/dL — ABNORMAL HIGH (ref 70–99)
Potassium: 3.6 mmol/L (ref 3.5–5.1)
Sodium: 139 mmol/L (ref 135–145)
Total Bilirubin: 0.7 mg/dL (ref 0.3–1.2)
Total Protein: 7.8 g/dL (ref 6.5–8.1)

## 2019-02-27 LAB — TROPONIN I (HIGH SENSITIVITY): Troponin I (High Sensitivity): 7 ng/L (ref <18)

## 2019-02-27 NOTE — ED Notes (Signed)
Updated with patient and family about wait time and results.  Patient understanding and family waiting in car.

## 2019-02-27 NOTE — ED Triage Notes (Signed)
Pt to triage via w/c with no distress noted, mask in place; pt reports for last several months has been having CP, worse this wk; st pain to upper chest radiating "up into neck, over my head and down both arms"; denies any accomp symptoms

## 2019-02-28 ENCOUNTER — Other Ambulatory Visit: Payer: Self-pay

## 2019-02-28 ENCOUNTER — Observation Stay (HOSPITAL_BASED_OUTPATIENT_CLINIC_OR_DEPARTMENT_OTHER): Payer: 59

## 2019-02-28 ENCOUNTER — Telehealth: Payer: Self-pay | Admitting: *Deleted

## 2019-02-28 ENCOUNTER — Encounter: Payer: Self-pay | Admitting: Physician Assistant

## 2019-02-28 ENCOUNTER — Observation Stay (HOSPITAL_BASED_OUTPATIENT_CLINIC_OR_DEPARTMENT_OTHER)
Admit: 2019-02-28 | Discharge: 2019-02-28 | Disposition: A | Discharge status: Home / Self Care | Payer: 59 | Attending: Physician Assistant | Admitting: Physician Assistant

## 2019-02-28 DIAGNOSIS — K219 Gastro-esophageal reflux disease without esophagitis: Secondary | ICD-10-CM | POA: Diagnosis not present

## 2019-02-28 DIAGNOSIS — R079 Chest pain, unspecified: Secondary | ICD-10-CM

## 2019-02-28 DIAGNOSIS — F4310 Post-traumatic stress disorder, unspecified: Secondary | ICD-10-CM

## 2019-02-28 DIAGNOSIS — Z21 Asymptomatic human immunodeficiency virus [HIV] infection status: Secondary | ICD-10-CM | POA: Diagnosis not present

## 2019-02-28 DIAGNOSIS — F17220 Nicotine dependence, chewing tobacco, uncomplicated: Secondary | ICD-10-CM

## 2019-02-28 DIAGNOSIS — Z880 Allergy status to penicillin: Secondary | ICD-10-CM

## 2019-02-28 DIAGNOSIS — Z9861 Coronary angioplasty status: Secondary | ICD-10-CM

## 2019-02-28 LAB — ECHOCARDIOGRAM COMPLETE
Height: 76 in
Weight: 3828.8 oz

## 2019-02-28 LAB — LIPID PANEL
Cholesterol: 236 mg/dL — ABNORMAL HIGH (ref 0–200)
HDL: 43 mg/dL (ref >40)
LDL Cholesterol: UNDETERMINED mg/dL (ref 0–99)
Total CHOL/HDL Ratio: 5.5 RATIO
Triglycerides: 452 mg/dL — ABNORMAL HIGH (ref <150)
VLDL: UNDETERMINED mg/dL (ref 0–40)

## 2019-02-28 LAB — SARS CORONAVIRUS 2 BY RT PCR (HOSPITAL ORDER, PERFORMED IN ~~LOC~~ HOSPITAL LAB): SARS Coronavirus 2: NEGATIVE

## 2019-02-28 LAB — LDL CHOLESTEROL, DIRECT: Direct LDL: 148.5 mg/dL — ABNORMAL HIGH (ref 0–99)

## 2019-02-28 LAB — TSH: TSH: 2.588 u[IU]/mL (ref 0.350–4.500)

## 2019-02-28 LAB — HIV ANTIBODY (ROUTINE TESTING W REFLEX): HIV Screen 4th Generation wRfx: REACTIVE — AB

## 2019-02-28 LAB — TROPONIN I (HIGH SENSITIVITY)
Troponin I (High Sensitivity): 8 ng/L (ref <18)
Troponin I (High Sensitivity): 9 ng/L (ref <18)

## 2019-02-28 LAB — HEMOGLOBIN A1C
Hgb A1c MFr Bld: 5.5 % (ref 4.8–5.6)
Mean Plasma Glucose: 111.15 mg/dL

## 2019-02-28 MED ORDER — REGADENOSON 0.4 MG/5ML IV SOLN
0.4000 mg | Freq: Once | INTRAVENOUS | Status: AC
  Administered 2019-02-28: 10:00:00 0.4 mg via INTRAVENOUS

## 2019-02-28 MED ORDER — ATORVASTATIN CALCIUM 40 MG PO TABS: 40.0000 mg | ORAL_TABLET | Freq: Every day | ORAL | 1 refills | Status: AC

## 2019-02-28 MED ORDER — AMLODIPINE BESYLATE 5 MG PO TABS: 5.0000 mg | ORAL_TABLET | Freq: Every day | ORAL | 1 refills | Status: AC

## 2019-02-28 MED ORDER — SERTRALINE HCL 50 MG PO TABS: 150.0000 mg | ORAL_TABLET | Freq: Every day | ORAL | Status: DC

## 2019-02-28 MED ORDER — ASPIRIN 81 MG PO CHEW
324.0000 mg | CHEWABLE_TABLET | Freq: Once | ORAL | Status: AC
  Administered 2019-02-28: 324 mg via ORAL
  Filled 2019-02-28: qty 4

## 2019-02-28 MED ORDER — PANTOPRAZOLE SODIUM 40 MG PO TBEC: 40.0000 mg | DELAYED_RELEASE_TABLET | Freq: Two times a day (BID) | ORAL | 1 refills | Status: AC

## 2019-02-28 MED ORDER — TECHNETIUM TC 99M TETROFOSMIN IV KIT
31.8500 | PACK | Freq: Once | INTRAVENOUS | Status: AC | PRN
  Administered 2019-02-28: 31.85 via INTRAVENOUS

## 2019-02-28 MED ORDER — ACETAMINOPHEN 325 MG PO TABS: 650.0000 mg | ORAL_TABLET | Freq: Four times a day (QID) | ORAL | Status: DC | PRN

## 2019-02-28 MED ORDER — ASPIRIN 81 MG PO CHEW: 81.0000 mg | CHEWABLE_TABLET | Freq: Every day | ORAL | Status: DC

## 2019-02-28 MED ORDER — ONDANSETRON HCL 4 MG/2ML IJ SOLN: 4.0000 mg | Freq: Four times a day (QID) | INTRAMUSCULAR | Status: DC | PRN

## 2019-02-28 MED ORDER — DOCUSATE SODIUM 100 MG PO CAPS: 100.0000 mg | ORAL_CAPSULE | Freq: Two times a day (BID) | ORAL | Status: DC

## 2019-02-28 MED ORDER — SODIUM CHLORIDE 0.9 % IV SOLN
INTRAVENOUS | Status: DC
  Administered 2019-02-28: 04:00:00 via INTRAVENOUS

## 2019-02-28 MED ORDER — ATORVASTATIN CALCIUM 20 MG PO TABS: 40.0000 mg | ORAL_TABLET | Freq: Every day | ORAL | Status: DC

## 2019-02-28 MED ORDER — ONDANSETRON HCL 4 MG PO TABS: 4.0000 mg | ORAL_TABLET | Freq: Four times a day (QID) | ORAL | Status: DC | PRN

## 2019-02-28 MED ORDER — AMLODIPINE BESYLATE 5 MG PO TABS: 5.0000 mg | ORAL_TABLET | Freq: Every day | ORAL | Status: DC

## 2019-02-28 MED ORDER — PANTOPRAZOLE SODIUM 40 MG PO TBEC: 40.0000 mg | DELAYED_RELEASE_TABLET | Freq: Two times a day (BID) | ORAL | Status: DC

## 2019-02-28 MED ORDER — TECHNETIUM TC 99M TETROFOSMIN IV KIT
10.0000 | PACK | Freq: Once | INTRAVENOUS | Status: AC | PRN
  Administered 2019-02-28: 10.526 via INTRAVENOUS

## 2019-02-28 MED ORDER — ENOXAPARIN SODIUM 40 MG/0.4ML ~~LOC~~ SOLN: 40.0000 mg | SUBCUTANEOUS | Status: DC

## 2019-02-28 MED ORDER — ACETAMINOPHEN 650 MG RE SUPP: 650.0000 mg | Freq: Four times a day (QID) | RECTAL | Status: DC | PRN

## 2019-02-28 NOTE — Plan of Care (Signed)
  Problem: Education: Goal: Knowledge of General Education information will improve Description: Including pain rating scale, medication(s)/side effects and non-pharmacologic comfort measures Outcome: Progressing Note: Patient admitted 0400. Patient profile completed. No complaints of chest pain

## 2019-02-28 NOTE — Consult Note (Signed)
Cardiology Consultation:   Patient ID: Dylan Shields; [id number]; 03-19-1976   Admit date: 02/27/2019 Date of Consult: 02/28/2019  Primary Care Provider: Derwood Kaplan, MD Primary Cardiologist: new to [facility] - consult by [facility]   Patient Profile:   Dylan Shields is a 43 y.o. male with a hx of patient reported CAD with prior MI approximately 20 years ago as detailed below, PTSD, obesity, and GERD who is being seen today for the evaluation of chest pain at the request of Dr. Sheryle Hail.  History of Present Illness:   [name] reports a history of prior MI approximately 20 years ago with hospitalization at [facility] with left heart cath at that time demonstrating a 30% stenosis with details being unclear.  He has been lost to follow-up since.  He was in his usual state of health up until approximately 2 months prior when he began to develop substernal chest pressure described as a "vice" squeezing him which would wake him up from sleep 1-2 times per week.  He indicates sitting up with improved symptoms in approximately 10 to 15 minutes.  There was some associated shortness of breath with these episodes.  More recently, the patient developed worsening substernal chest discomfort that radiated up into the bilateral sides of his neck and into his head with subsequent headache followed by pain in the bilateral arms with symptoms lasting for 30 minutes with spontaneous resolution.  Again, there was some associated shortness of breath with this.  No palpitations, nausea, vomiting, diaphoresis, dizziness, presyncope, or syncope.  He attempted to go to work on the morning of 10/6 after resolution of his symptoms though while at work symptoms returned, however not as severe, prompting him to come in for evaluation.  He reports a remote history of tobacco use, smoking 1 to 2 cigarettes every 3 to 4 months approximately 10 years ago.  He denies any further alcohol  use.  He denies any illicit drugs.  He indicates his father underwent quadruple bypass around age 66-60.  Upon the patient's arrival to [facility] they were found to have stable vitals. EKG showed sinus tachycardia, 109 bpm, no acute st/t changes, CXR showed no active disease. Labs showed high sensitivity troponin 7-->8-->9, K+ 3.6, SCr 1.29, normal LFT, normal CBC, COVID-19 negative, normal TSH. He was given ASA 324 in the ED. Upon admission, cardiology was asked to evaluate. Currently, chest pain-free.  Past Medical History:  Diagnosis Date  . GERD (gastroesophageal reflux disease)   . Post traumatic stress disorder (PTSD)     Past Surgical History:  Procedure Laterality Date  . CARDIAC CATHETERIZATION  2005   clear     Home Meds: Prior to Admission medications   Medication Sig Start Date End Date Taking? Authorizing Provider  sertraline (ZOLOFT) 100 MG tablet Take 150 mg by mouth daily.    Yes [provider]    Inpatient Medications: Scheduled Meds: . [START ON 03/01/2019] aspirin  81 mg Oral Daily  . docusate sodium  100 mg Oral BID  . enoxaparin (LOVENOX) injection  40 mg Subcutaneous Q24H  . pantoprazole  40 mg Oral BID  . sertraline  150 mg Oral Daily   Continuous Infusions: . sodium chloride 125 mL/hr at 02/28/19 0414   PRN Meds: acetaminophen **OR** acetaminophen, ondansetron **OR** ondansetron (ZOFRAN) IV  Allergies:   Allergies  Allergen Reactions  . Penicillin G Other (See Comments)    Reaction: unknown Did it involve swelling of the  face/tongue/throat, SOB, or low BP? Unknown Did it involve sudden or severe rash/hives, skin peeling, or any reaction on the inside of your mouth or nose? Unknown Did you need to seek medical attention at a hospital or doctor's office? Unknown When did it last happen?During infancy If all above answers are "NO", may proceed with cephalosporin use.      Social History:   Social History   Socioeconomic History  .  Marital status: Married    Spouse name: Not on file  . Number of children: Not on file  . Years of education: Not on file  . Highest education level: Not on file  Occupational History  . Not on file  Social Needs  . Financial resource strain: Not on file  . Food insecurity    Worry: Not on file    Inability: Not on file  . Transportation needs    Medical: Not on file    Non-medical: Not on file  Tobacco Use  . Smoking status: Never Smoker  . Smokeless tobacco: Current User    Types: Snuff  Substance and Sexual Activity  . Alcohol use: Yes    Alcohol/week: 0.0 standard drinks  . Drug use: No  . Sexual activity: Yes  Lifestyle  . Physical activity    Days per week: Not on file    Minutes per session: Not on file  . Stress: Not on file  Relationships  . Social Musicianconnections    Talks on phone: Not on file    Gets together: Not on file    Attends religious service: Not on file    Active member of club or organization: Not on file    Attends meetings of clubs or organizations: Not on file    Relationship status: Not on file  . Intimate partner violence    Fear of current or ex partner: Not on file    Emotionally abused: Not on file    Physically abused: Not on file    Forced sexual activity: Not on file  Other Topics Concern  . Not on file  Social History Narrative  . Not on file     Family History:   Family History  Problem Relation Age of Onset  . Diabetes Father   . Hyperlipidemia Father   . Hypertension Father   . Heart disease Father        a.  CABG around age 44-60    ROS:  Review of Systems  Constitutional: Positive for malaise/fatigue. Negative for chills, diaphoresis, fever and weight loss.  HENT: Negative for congestion.   Eyes: Negative for discharge and redness.  Respiratory: Positive for shortness of breath. Negative for cough, hemoptysis, sputum production and wheezing.   Cardiovascular: Positive for chest pain. Negative for palpitations,  orthopnea, claudication, leg swelling and PND.  Gastrointestinal: Negative for abdominal pain, blood in stool, heartburn, melena, nausea and vomiting.  Genitourinary: Negative for hematuria.  Musculoskeletal: Negative for falls and myalgias.  Skin: Negative for rash.  Neurological: Positive for weakness. Negative for dizziness, tingling, tremors, sensory change, speech change, focal weakness and loss of consciousness.  Endo/Heme/Allergies: Does not bruise/bleed easily.  Psychiatric/Behavioral: Negative for substance abuse. The patient is not nervous/anxious.   All other systems reviewed and are negative.     Physical Exam/Data:   Vitals:   02/28/19 0402 02/28/19 0403 02/28/19 0447 02/28/19 0735  BP:  (!) 154/94 (!) 145/94 (!) 142/98  Pulse:  97 97 91  Resp:  18  19  Temp:  98 F (36.7 C)  98 F (36.7 C)  TempSrc:  Oral  Oral  SpO2:  98%  98%  Weight: 108.5 kg     Height: 6\' 4"  (1.93 m)       Intake/Output Summary (Last 24 hours) at 02/28/2019 0840 Last data filed at 02/28/2019 0743 Gross per 24 hour  Intake 343.03 ml  Output 0 ml  Net 343.03 ml   Filed Weights   02/27/19 2014 02/28/19 0402  Weight: 97.5 kg 108.5 kg   Body mass index is 29.13 kg/m.   Physical Exam: General: Well developed, well nourished, in no acute distress. Head: Normocephalic, atraumatic, sclera non-icteric, no xanthomas, nares without discharge.  Neck: Negative for carotid bruits. JVD not elevated. Lungs: Clear bilaterally to auscultation without wheezes, rales, or rhonchi. Breathing is unlabored. Heart: RRR with S1 S2. No murmurs, rubs, or gallops appreciated. Abdomen: Soft, non-tender, non-distended with normoactive bowel sounds. No hepatomegaly. No rebound/guarding. No obvious abdominal masses. Msk:  Strength and tone appear normal for age. Extremities: No clubbing or cyanosis. No edema. Distal pedal pulses are 2+ and equal bilaterally. Neuro: Alert and oriented X 3. No facial asymmetry. No  focal deficit. Moves all extremities spontaneously. Psych:  Responds to questions appropriately with a normal affect.   EKG:  The EKG was personally reviewed and demonstrates: sinus tachycardia, 109 bpm, no acute st/t changes Telemetry:  Telemetry was personally reviewed and demonstrates: SR  Weights: Filed Weights   02/27/19 2014 02/28/19 0402  Weight: 97.5 kg 108.5 kg    Relevant CV Studies: none  Laboratory Data:  Chemistry Recent Labs  Lab 02/27/19 2017  NA 139  K 3.6  CL 103  CO2 23  GLUCOSE 106*  BUN 13  CREATININE 1.29*  CALCIUM 9.0  GFRNONAA >60  GFRAA >60  ANIONGAP 13    Recent Labs  Lab 02/27/19 2017  PROT 7.8  ALBUMIN 4.5  AST 30  ALT 40  ALKPHOS 90  BILITOT 0.7   Hematology Recent Labs  Lab 02/27/19 2017  WBC 7.8  RBC 4.98  HGB 15.0  HCT 43.6  MCV 87.6  MCH 30.1  MCHC 34.4  RDW 13.2  PLT 248   Cardiac EnzymesNo results for input(s): TROPONINI in the last 168 hours. No results for input(s): TROPIPOC in the last 168 hours.  BNPNo results for input(s): BNP, PROBNP in the last 168 hours.  DDimer No results for input(s): DDIMER in the last 168 hours.  Radiology/Studies:  Dg Chest 2 View  Result Date: 02/27/2019 IMPRESSION: No active cardiopulmonary disease. Electronically Signed   By: Donavan Foil M.D.   On: 02/27/2019 20:34    Assessment and Plan:   1. Chest pain with moderate risk for cardiac etiology with reported CAD: -Patient reports a history of prior MI 20 years ago at [facility] with left heart cath demonstrating 30% stenosis with details being unclear.  He has not followed up with cardiology since. -He notes an intermittent history of chest discomfort described as a vice squeezing him that has been ongoing for the past 2 months occurring several times per week, typically at nighttime waking him up from sleep with the most recent episode occurring on the morning of 10/6 prompting him to come in for evaluation. -Currently chest  pain-free -High-sensitivity troponin negative x3 -No indication for heparin drip at this time -Schedule Lexiscan Myoview today to evaluate for high risk ischemia -Check echo -Check lipid panel and A1c for further risk stratification -ASA -Metoprolol  -  Symptoms do not appear to be consistent with PE, at least at this time given sinus rhythm and normal oxygen saturation.  However, if echo demonstrates dilated right ventricle this may need to be considered  2.  Shortness of breath/fatigue: -Ischemic evaluation as above -Check echo -Cannot exclude some component of obesity and physical deconditioning  3.  Elevated BP without diagnosis of hypertension: -No formal prior diagnosis of hypertension -BP has been on the high side this admission, possibly in the setting of the above -Continue to monitor with potential addition of antihypertensive medication prior to discharge   For questions or updates, please contact [facility] Please consult [email or web address] for contact info under Cardiology/STEMI.   Signed, Eula Listen, PA-C [facility] Pager: [phone] 02/28/2019, 8:40 AM

## 2019-02-28 NOTE — H&P (Addendum)
Dylan Shields is an 43 y.o. male.   Chief Complaint: Chest pain HPI: The patient with past medical history of NSTEMI, GERD and posttraumatic stress disorder presents to the emergency department complaining of chest pain.  The patient had a "heart attack 20 years ago" and underwent cardiac catheterization at that time that, he says, showed a "30% blockage" in 1 of his coronary arteries.  Since that time he has been relatively pain-free until the last few weeks when he has had multiple episodes of brief chest pain.  Sometimes he reports the pain wakes him up at night.  He describes a squeezing or bandlike compression of his chest when the pain occurs.  It radiates into his neck and gives him a headache.  The patient reports the pain then radiates down both arms and creates a tingling sensation in his fingers.  He admits to associated shortness of breath but denies nausea, vomiting or diaphoresis.  Last night the patient was making a plate for dinner and had pain which caused him to drop his plate.  The pain was brief.  In the emergency department he received aspirin.  EKG was unremarkable and cardiac enzymes were negative.  Nonetheless, due to recurrent pain and history of CAD the emergency department staff, hospitalist service for further evaluation.  Past Medical History:  Diagnosis Date  . GERD (gastroesophageal reflux disease)   . Post traumatic stress disorder (PTSD)     Past Surgical History:  Procedure Laterality Date  . CARDIAC CATHETERIZATION  2005   clear    Family History  Problem Relation Age of Onset  . Diabetes Father   . Hyperlipidemia Father   . Hypertension Father   . Heart disease Father    Social History:  reports that he has never smoked. His smokeless tobacco use includes snuff. He reports current alcohol use. He reports that he does not use drugs.  Allergies:  Allergies  Allergen Reactions  . Penicillin G Other (See Comments)    Reaction: unknown Did it  involve swelling of the face/tongue/throat, SOB, or low BP? Unknown Did it involve sudden or severe rash/hives, skin peeling, or any reaction on the inside of your mouth or nose? Unknown Did you need to seek medical attention at a hospital or doctor's office? Unknown When did it last happen?During infancy If all above answers are "NO", may proceed with cephalosporin use.      Medications Prior to Admission  Medication Sig Dispense Refill  . sertraline (ZOLOFT) 100 MG tablet Take 150 mg by mouth daily.       Results for orders placed or performed during the hospital encounter of 02/27/19 (from the past 48 hour(s))  CBC with Differential     Status: None   Collection Time: 02/27/19  8:17 PM  Result Value Ref Range   WBC 7.8 4.0 - 10.5 K/uL   RBC 4.98 4.22 - 5.81 MIL/uL   Hemoglobin 15.0 13.0 - 17.0 g/dL   HCT 43.6 39.0 - 52.0 %   MCV 87.6 80.0 - 100.0 fL   MCH 30.1 26.0 - 34.0 pg   MCHC 34.4 30.0 - 36.0 g/dL   RDW 13.2 11.5 - 15.5 %   Platelets 248 150 - 400 K/uL   nRBC 0.0 0.0 - 0.2 %   Neutrophils Relative % 48 %   Neutro Abs 3.7 1.7 - 7.7 K/uL   Lymphocytes Relative 39 %   Lymphs Abs 3.1 0.7 - 4.0 K/uL   Monocytes Relative 7 %  Monocytes Absolute 0.6 0.1 - 1.0 K/uL   Eosinophils Relative 5 %   Eosinophils Absolute 0.4 0.0 - 0.5 K/uL   Basophils Relative 1 %   Basophils Absolute 0.1 0.0 - 0.1 K/uL   Immature Granulocytes 0 %   Abs Immature Granulocytes 0.02 0.00 - 0.07 K/uL    Comment: Performed at [facility], [address] [place], [place] [address]  Comprehensive metabolic panel     Status: Abnormal   Collection Time: 02/27/19  8:17 PM  Result Value Ref Range   Sodium 139 135 - 145 mmol/L   Potassium 3.6 3.5 - 5.1 mmol/L   Chloride 103 98 - 111 mmol/L   CO2 23 22 - 32 mmol/L   Glucose, Bld 106 (H) 70 - 99 mg/dL   BUN 13 6 - 20 mg/dL   Creatinine, Ser 2.94 (H) 0.61 - 1.24 mg/dL   Calcium 9.0 8.9 - 76.5 mg/dL   Total Protein 7.8 6.5 - 8.1 g/dL    Albumin 4.5 3.5 - 5.0 g/dL   AST 30 15 - 41 U/L   ALT 40 0 - 44 U/L   Alkaline Phosphatase 90 38 - 126 U/L   Total Bilirubin 0.7 0.3 - 1.2 mg/dL   GFR calc non Af Amer >60 >60 mL/min   GFR calc Af Amer >60 >60 mL/min   Anion gap 13 5 - 15    Comment: Performed at [facility], [address], [place], [place] [address]  Troponin I (High Sensitivity)     Status: None   Collection Time: 02/27/19  8:17 PM  Result Value Ref Range   Troponin I (High Sensitivity) 7 <18 ng/L    Comment: (NOTE) Elevated high sensitivity troponin I (hsTnI) values and significant  changes across serial measurements may suggest ACS but many other  chronic and acute conditions are known to elevate hsTnI results.  Refer to the "Links" section for chest pain algorithms and additional  guidance. Performed at [facility], [address] [place], [place] [address]   Troponin I (High Sensitivity)     Status: None   Collection Time: 02/28/19 12:26 AM  Result Value Ref Range   Troponin I (High Sensitivity) 8 <18 ng/L    Comment: (NOTE) Elevated high sensitivity troponin I (hsTnI) values and significant  changes across serial measurements may suggest ACS but many other  chronic and acute conditions are known to elevate hsTnI results.  Refer to the "Links" section for chest pain algorithms and additional  guidance. Performed at [facility], [address] [place], [place] [address]   SARS Coronavirus 2 [facility] order, Performed in [facility] hospital lab) Nasopharyngeal Nasopharyngeal Swab     Status: None   Collection Time: 02/28/19 12:26 AM   Specimen: Nasopharyngeal Swab  Result Value Ref Range   SARS Coronavirus 2 NEGATIVE NEGATIVE    Comment: (NOTE) If result is NEGATIVE SARS-CoV-2 target nucleic acids are NOT DETECTED. The SARS-CoV-2 RNA is generally detectable in upper and lower  respiratory specimens during the acute phase of infection. The lowest  concentration of  SARS-CoV-2 viral copies this assay can detect is 250  copies / mL. A negative result does not preclude SARS-CoV-2 infection  and should not be used as the sole basis for treatment or other  patient management decisions.  A negative result may occur with  improper specimen collection / handling, submission of specimen other  than nasopharyngeal swab, presence of viral mutation(s) within the  areas  targeted by this assay, and inadequate number of viral copies  (<250 copies / mL). A negative result must be combined with clinical  observations, patient history, and epidemiological information. If result is POSITIVE SARS-CoV-2 target nucleic acids are DETECTED. The SARS-CoV-2 RNA is generally detectable in upper and lower  respiratory specimens dur ing the acute phase of infection.  Positive  results are indicative of active infection with SARS-CoV-2.  Clinical  correlation with patient history and other diagnostic information is  necessary to determine patient infection status.  Positive results do  not rule out bacterial infection or co-infection with other viruses. If result is PRESUMPTIVE POSTIVE SARS-CoV-2 nucleic acids MAY BE PRESENT.   A presumptive positive result was obtained on the submitted specimen  and confirmed on repeat testing.  While 2019 novel coronavirus  (SARS-CoV-2) nucleic acids may be present in the submitted sample  additional confirmatory testing may be necessary for epidemiological  and / or clinical management purposes  to differentiate between  SARS-CoV-2 and other Sarbecovirus currently known to infect humans.  If clinically indicated additional testing with an alternate test  methodology [phone]) is advised. The SARS-CoV-2 RNA is generally  detectable in upper and lower respiratory sp ecimens during the acute  phase of infection. The expected result is Negative. Fact Sheet for Patients:  BoilerBrush.com.cy Fact Sheet for Healthcare  Providers: https://pope.com/ This test is not yet approved or cleared by the [place] FDA and has been authorized for detection and/or diagnosis of SARS-CoV-2 by FDA under an Emergency Use Authorization (EUA).  This EUA will remain in effect (meaning this test can be used) for the duration of the COVID-19 declaration under Section 564(b)(1) of the Act, 21 U.S.C. section 360bbb-3(b)(1), unless the authorization is terminated or revoked sooner. Performed at [facility], [address] [place], [place] [address]    Dg Chest 2 View  Result Date: 02/27/2019 CLINICAL DATA:  Chest pain EXAM: CHEST - 2 VIEW COMPARISON:  None. FINDINGS: The heart size and mediastinal contours are within normal limits. Both lungs are clear. The visualized skeletal structures are unremarkable. IMPRESSION: No active cardiopulmonary disease. Electronically Signed   By: Jasmine Pang M.D.   On: 02/27/2019 20:34    Review of Systems  Constitutional: Negative for chills and fever.  HENT: Negative for sore throat and tinnitus.   Eyes: Negative for blurred vision and redness.  Respiratory: Negative for cough and shortness of breath.   Cardiovascular: Positive for chest pain. Negative for palpitations, orthopnea and PND.  Gastrointestinal: Negative for abdominal pain, diarrhea, nausea and vomiting.  Genitourinary: Negative for dysuria, frequency and urgency.  Musculoskeletal: Negative for joint pain and myalgias.  Skin: Negative for rash.       No lesions  Neurological: Negative for speech change, focal weakness and weakness.  Endo/Heme/Allergies: Does not bruise/bleed easily.       No temperature intolerance  Psychiatric/Behavioral: Negative for depression and suicidal ideas.    Blood pressure (!) 136/99, pulse 100, temperature 97.8 F (36.6 C), temperature source Oral, resp. rate 19, height  (1.905 m), weight 97.5 kg, SpO2 97 %. Physical Exam  Vitals  reviewed. Constitutional: He is oriented to person, place, and time. He appears well-developed and well-nourished. No distress.  HENT:  Head: Normocephalic and atraumatic.  Mouth/Throat: Oropharynx is clear and moist.  Eyes: Pupils are equal, round, and reactive to light. Conjunctivae and EOM are normal. No scleral icterus.  Neck: Normal range of motion. Neck supple. No JVD present. No  tracheal deviation present. No thyromegaly present.  Cardiovascular: Normal rate and regular rhythm. Exam reveals no gallop and no friction rub.  No murmur heard. Respiratory: Effort normal and breath sounds normal. No respiratory distress.  GI: Soft. Bowel sounds are normal. He exhibits no distension. There is no abdominal tenderness.  Genitourinary:    Genitourinary Comments: Deferred   Musculoskeletal: Normal range of motion.        General: No edema.  Lymphadenopathy:    He has no cervical adenopathy.  Neurological: He is alert and oriented to person, place, and time. No cranial nerve deficit.  Skin: Skin is warm and dry. No rash noted. No erythema.  Psychiatric: He has a normal mood and affect. His behavior is normal. Judgment and thought content normal.     Assessment/Plan This is a 43 year old male admitted for chest pain. 1.  Chest pain: Atypical features but in light of history of NSTEMI concerning enough for further evaluation.  Continue to follow cardiac biomarkers and monitor telemetry.  Consult cardiology. 2.  CAD: Under evaluation; continue aspirin 3.  GERD: I have restarted pantoprazole as reflux may be a cause of atypical chest pain 4.  PTSD: Continue Zoloft 5.  DVT prophylaxis: Lovenox 6.  GI prophylaxis: As above The patient is a full code.  Time spent on admission orders and patient care approximately 45 minutes  Arnaldo Nataliamond,  Greysen Swanton S, MD 02/28/2019, 2:34 AM

## 2019-02-28 NOTE — Telephone Encounter (Signed)
-----   Message from Rise Mu, PA-C sent at 02/28/2019  4:13 PM EDT ----- Please have the patient see one of [place] for hospital follow up within 2 weeks. Thanks!

## 2019-02-28 NOTE — ED Provider Notes (Signed)
[facility] Emergency Department Provider Note  ____________________________________________  Time seen: Approximately 12:42 AM  I have reviewed the triage vital signs and the nursing notes.   HISTORY  Chief Complaint Chest Pain   HPI Dylan Shields is a 43 y.o. male with a history of CAD and GERD who presents for evaluation of chest pain.  Patient reports that he had a heart attack in 2005.  He said he underwent a catheterization and was told he had a 30% blockage.  Over the last 2 months he has been having chest pain which she describes as severe pressure in the center of his chest associated with shortness of breath.  Until 2 days ago the pain happened only at night and used to wake up him from his sleep a couple times a night.   Patient reports that over the weekend he had 2 episodes while awake of severe chest pressure associated with dizziness and shortness of breath.  Today while eating dinner he had another episode which made his wife bring him to the emergency room for evaluation.  He describes episodes today as severe central chest pressure radiating up to his neck and down to bilateral arms associated with shortness of breath, dizziness, paresthesias of his arms.  He is pain-free at this time.  He is not a smoker.  He has family history of heart attacks and CABG in his father.  Patient denies alcohol use or drug use.  Patient has not had a cardiologist for over 10 years now.  Past Medical History:  Diagnosis Date  . GERD (gastroesophageal reflux disease)   . Post traumatic stress disorder (PTSD)     There are no active problems to display for this patient.   Past Surgical History:  Procedure Laterality Date  . CARDIAC CATHETERIZATION  2005   clear    Prior to Admission medications   Medication Sig Start Date End Date Taking? Authorizing Provider  sertraline (ZOLOFT) 50 MG tablet Take 50 mg by mouth daily. Takes 150mg  total    [provider]    Allergies Penicillin g  Family History  Problem Relation Age of Onset  . Diabetes Father   . Hyperlipidemia Father   . Hypertension Father   . Heart disease Father     Social History Social History   Tobacco Use  . Smoking status: Never Smoker  . Smokeless tobacco: Current User    Types: Snuff  Substance Use Topics  . Alcohol use: Yes    Alcohol/week: 0.0 standard drinks  . Drug use: No    Review of Systems  Constitutional: Negative for fever. + Lightheadedness Eyes: Negative for visual changes. ENT: Negative for sore throat. Neck: No neck pain  Cardiovascular: + chest pain. Respiratory: + shortness of breath. Gastrointestinal: Negative for abdominal pain, vomiting or diarrhea. Genitourinary: Negative for dysuria. Musculoskeletal: Negative for back pain. Skin: Negative for rash. Neurological: Negative for headaches, weakness or numbness. Psych: No SI or HI  ____________________________________________   PHYSICAL EXAM:  VITAL SIGNS: ED Triage Vitals  Enc Vitals Group     BP 02/27/19 2016 136/86     Pulse Rate 02/27/19 2016 100     Resp 02/27/19 2016 18     Temp 02/27/19 2016 97.8 F (36.6 C)     Temp Source 02/27/19 2016 Oral     SpO2 02/27/19 2016 98 %     Weight 02/27/19 2014 215 lb (97.5 kg)     Height 02/27/19 2014  6\' 3"  (1.905 m)     Head Circumference --      Peak Flow --      Pain Score 02/27/19 2014 0     Pain Loc --      Pain Edu? --      Excl. in GC? --     Constitutional: Alert and oriented. Well appearing and in no apparent distress. HEENT:      Head: Normocephalic and atraumatic.         Eyes: Conjunctivae are normal. Sclera is non-icteric.       Mouth/Throat: Mucous membranes are moist.       Neck: Supple with no signs of meningismus. Cardiovascular: Regular rate and rhythm. No murmurs, gallops, or rubs. 2+ symmetrical distal pulses are present in all extremities. No JVD. Respiratory: Normal respiratory effort.  Lungs are clear to auscultation bilaterally. No wheezes, crackles, or rhonchi.  Gastrointestinal: Soft, non tender, and non distended with positive bowel sounds. No rebound or guarding. Musculoskeletal: Nontender with normal range of motion in all extremities. No edema, cyanosis, or erythema of extremities. Neurologic: Normal speech and language. Face is symmetric. Moving all extremities. No gross focal neurologic deficits are appreciated. Skin: Skin is warm, dry and intact. No rash noted. Psychiatric: Mood and affect are normal. Speech and behavior are normal.  ____________________________________________   LABS (all labs ordered are listed, but only abnormal results are displayed)  Labs Reviewed  COMPREHENSIVE METABOLIC PANEL - Abnormal; Notable for the following components:      Result Value   Glucose, Bld 106 (*)    Creatinine, Ser 1.29 (*)    All other components within normal limits  SARS CORONAVIRUS 2 (HOSPITAL ORDER, PERFORMED IN  HOSPITAL LAB)  CBC WITH DIFFERENTIAL/PLATELET  TROPONIN I (HIGH SENSITIVITY)  TROPONIN I (HIGH SENSITIVITY)   ____________________________________________  EKG  ED ECG REPORT I, Nita Sicklearolina Seidy Labreck, the attending physician, personally viewed and interpreted this ECG.  Sinus tachycardia, rate of 109, normal intervals, normal axis, no ST elevations or depressions.  Otherwise normal EKG. ____________________________________________  RADIOLOGY  I have personally reviewed the images performed during this visit and I agree with the Radiologist's read.   Interpretation by Radiologist:  Dg Chest 2 View  Result Date: 02/27/2019 CLINICAL DATA:  Chest pain EXAM: CHEST - 2 VIEW COMPARISON:  None. FINDINGS: The heart size and mediastinal contours are within normal limits. Both lungs are clear. The visualized skeletal structures are unremarkable. IMPRESSION: No active cardiopulmonary disease. Electronically Signed   By: Jasmine PangKim  Fujinaga M.D.   On:  02/27/2019 20:34     ____________________________________________   PROCEDURES  Procedure(s) performed: None Procedures Critical Care performed:  None ____________________________________________   INITIAL IMPRESSION / ASSESSMENT AND PLAN / ED COURSE  43 y.o. male with a history of CAD and GERD who presents for evaluation of chest pain.   History of chest pain concerning for possible cardiac etiology. Exam is benign. EKG and initial cardiac labs are without evidence of ischemia. Clinical picture and evaluation not suggestive of other serious cause including pneumonia, pulmonary embolism, pneumothorax, esophageal rupture, or aortic dissection. Patient will be admitted for chest pain evaluation.  I discussed the diagnosis and plan of care with the patient and answered all his questions. He states understanding and is in agreement with admission for further work up.  Patient given aspirin.        As part of my medical decision making, I reviewed the following data within the electronic MEDICAL RECORD NUMBER History  obtained from family, Nursing notes reviewed and incorporated, Labs reviewed , EKG interpreted , Old EKG reviewed, Old chart reviewed, Radiograph reviewed , Discussed with admitting physician , Notes from prior ED visits and [place] Controlled Substance Database   Patient was evaluated in Emergency Department today for the symptoms described in the history of present illness. Patient was evaluated in the context of the global COVID-19 pandemic, which necessitated consideration that the patient might be at risk for infection with the SARS-CoV-2 virus that causes COVID-19. Institutional protocols and algorithms that pertain to the evaluation of patients at risk for COVID-19 are in a state of rapid change based on information released by regulatory bodies including the CDC and federal and state organizations. These policies and algorithms were followed during the patient's care in the ED.    ____________________________________________   FINAL CLINICAL IMPRESSION(S) / ED DIAGNOSES   Final diagnoses:  Chest pain, unspecified type      NEW MEDICATIONS STARTED DURING THIS VISIT:  ED Discharge Orders    None       Note:  This document was prepared using Dragon voice recognition software and may include unintentional dictation errors.    Alfred Levins, [place], MD 02/28/19 0100

## 2019-02-28 NOTE — Progress Notes (Signed)
*  PRELIMINARY RESULTS* Echocardiogram 2D Echocardiogram has been performed.  Dylan Shields [name] 02/28/2019, 10:57 AM

## 2019-02-28 NOTE — Progress Notes (Signed)
[facility] [place] [facility]   PATIENT NAME: Dylan Shields    MR#:  [id number]  DATE OF BIRTH:  Aug 04, 1975  SUBJECTIVE:  CHIEF COMPLAINT:   Chief Complaint  Patient presents with  . Chest Pain   -Patient admitted with chest pain.  None currently. -Status post Myoview this morning  REVIEW OF SYSTEMS:  Review of Systems  Constitutional: Negative for chills, fever and malaise/fatigue.  HENT: Negative for congestion, hearing loss, nosebleeds and tinnitus.   Eyes: Negative for blurred vision, double vision and discharge.  Respiratory: Negative for cough, shortness of breath and wheezing.   Cardiovascular: Negative for chest pain, palpitations and leg swelling.  Gastrointestinal: Negative for abdominal pain, constipation, diarrhea, nausea and vomiting.  Genitourinary: Negative for dysuria.  Musculoskeletal: Negative for myalgias.  Neurological: Negative for dizziness, focal weakness, seizures, weakness and headaches.    DRUG ALLERGIES:   Allergies  Allergen Reactions  . Penicillin G Other (See Comments)    Reaction: unknown Did it involve swelling of the face/tongue/throat, SOB, or low BP? Unknown Did it involve sudden or severe rash/hives, skin peeling, or any reaction on the inside of your mouth or nose? Unknown Did you need to seek medical attention at a hospital or doctor's office? Unknown When did it last happen?During infancy If all above answers are "NO", may proceed with cephalosporin use.      VITALS:  Blood pressure (!) 142/98, pulse 91, temperature 98 F (36.7 C), temperature source Oral, resp. rate 19, height 6\' 4"  (1.93 m), weight 108.5 kg, SpO2 98 %.  PHYSICAL EXAMINATION:  Physical Exam  GENERAL:  43 y.o.-year-old patient lying in the bed with no acute distress.  EYES: Pupils equal, round, reactive to light and accommodation. No scleral icterus. Extraocular muscles intact.  HEENT: Head atraumatic, normocephalic. Oropharynx  and nasopharynx clear.  NECK:  Supple, no jugular venous distention. No thyroid enlargement, no tenderness.  LUNGS: Normal breath sounds bilaterally, no wheezing, rales,rhonchi or crepitation. No use of accessory muscles of respiration.  CARDIOVASCULAR: S1, S2 normal. No murmurs, rubs, or gallops.  ABDOMEN: Soft, nontender, nondistended. Bowel sounds present. No organomegaly or mass.  EXTREMITIES: No pedal edema, cyanosis, or clubbing.  NEUROLOGIC: Cranial nerves II through XII are intact. Muscle strength 5/5 in all extremities. Sensation intact. Gait not checked.  PSYCHIATRIC: The patient is alert and oriented x 3.  SKIN: No obvious rash, lesion, or ulcer.    LABORATORY PANEL:   CBC Recent Labs  Lab 02/27/19 2017  WBC 7.8  HGB 15.0  HCT 43.6  PLT 248   ------------------------------------------------------------------------------------------------------------------  Chemistries  Recent Labs  Lab 02/27/19 2017  NA 139  K 3.6  CL 103  CO2 23  GLUCOSE 106*  BUN 13  CREATININE 1.29*  CALCIUM 9.0  AST 30  ALT 40  ALKPHOS 90  BILITOT 0.7   ------------------------------------------------------------------------------------------------------------------  Cardiac Enzymes No results for input(s): TROPONINI in the last 168 hours. ------------------------------------------------------------------------------------------------------------------  RADIOLOGY:  Dg Chest 2 View  Result Date: 02/27/2019 CLINICAL DATA:  Chest pain EXAM: CHEST - 2 VIEW COMPARISON:  None. FINDINGS: The heart size and mediastinal contours are within normal limits. Both lungs are clear. The visualized skeletal structures are unremarkable. IMPRESSION: No active cardiopulmonary disease. Electronically Signed   By: 04/29/2019 M.D.   On: 02/27/2019 20:34    EKG:   Orders placed or performed during the hospital encounter of 02/27/19  . ED EKG  . ED EKG    ASSESSMENT AND  PLAN:   43 year old male  with past medical history significant for history of smoking, prior CAD, PTSD and GERD presents to hospital secondary to chest pain.  1.  Chest pain-concern for possible reflux disease with esophageal spasms versus musculoskeletal pain.  Atypical in presentation for angina. -Troponins are negative.  Currently chest pain-free -Appreciate cardiology consult.  For Myoview and echocardiogram today -If Myoview is negative, will discharge on PPI -Currently on aspirin and metoprolol  2.  Hyperlipidemia-elevated LDL and triglycerides.  Started on statin.  3.  Hypertension-started on Norvasc  4.  PTSD-on Zoloft  5.  DVT prophylaxis-Lovenox  Patient is independent at baseline   All the records are reviewed and case discussed with Care Management/Social Workerr. Management plans discussed with the patient, family and they are in agreement.  CODE STATUS: Full code  TOTAL TIME TAKING CARE OF THIS PATIENT: 38 minutes.   POSSIBLE D/C IN 1-2 DAYS, DEPENDING ON CLINICAL CONDITION.   Gladstone Lighter M.D on 02/28/2019 at 12:19 PM  Between 7am to 6pm - Pager - [phone]  After 6pm go to [email or web address] - password [email or web address] [facility]  Office  [phone]  CC: Primary care physician; Marden Noble, MD

## 2019-02-28 NOTE — ED Notes (Signed)
ED TO INPATIENT HANDOFF REPORT  ED Nurse Name and Phone #:  Anson Crofts Name/Age/Gender Dylan Shields 43 y.o. male Room/Bed: ED25A/ED25A  Code Status   Code Status: Not on file  Home/SNF/Other Home Patient oriented to: self, place, time and situation Is this baseline? Yes   Triage Complete: Triage complete  Chief Complaint chest pain  Triage Note Pt to triage via w/c with no distress noted, mask in place; pt reports for last several months has been having CP, worse this wk; st pain to upper chest radiating "up into neck, over my head and down both arms"; denies any accomp symptoms   Allergies Allergies  Allergen Reactions  . Penicillin G Other (See Comments)    Reaction: unknown Did it involve swelling of the face/tongue/throat, SOB, or low BP? Unknown Did it involve sudden or severe rash/hives, skin peeling, or any reaction on the inside of your mouth or nose? Unknown Did you need to seek medical attention at a hospital or doctor's office? Unknown When did it last happen?During infancy If all above answers are "NO", may proceed with cephalosporin use.      Level of Care/Admitting Diagnosis ED Disposition    ED Disposition Condition [facility] Area: [place] [[id number]]  Level of Care: Telemetry [5]  Covid Evaluation: Asymptomatic Screening Protocol (No Symptoms)  Diagnosis: Chest pain [502774]  Admitting Physician: Harrie Foreman [[id number]]  Attending Physician: Harrie Foreman [phone]  PT Class (Do Not Modify): Observation [104]  PT Acc Code (Do Not Modify): Observation [10022]       B Medical/Surgery History Past Medical History:  Diagnosis Date  . GERD (gastroesophageal reflux disease)   . Post traumatic stress disorder (PTSD)    Past Surgical History:  Procedure Laterality Date  . CARDIAC CATHETERIZATION  2005   clear     A IV Location/Drains/Wounds Patient Lines/Drains/Airways Status   Active  Line/Drains/Airways    Name:   Placement date:   Placement time:   Site:   Days:   Peripheral IV 02/28/19 Left Antecubital   02/28/19    0020    Antecubital   less than 1          Intake/Output Last 24 hours No intake or output data in the 24 hours ending 02/28/19 0335  Labs/Imaging Results for orders placed or performed during the hospital encounter of 02/27/19 (from the past 48 hour(s))  CBC with Differential     Status: None   Collection Time: 02/27/19  8:17 PM  Result Value Ref Range   WBC 7.8 4.0 - 10.5 K/uL   RBC 4.98 4.22 - 5.81 MIL/uL   Hemoglobin 15.0 13.0 - 17.0 g/dL   HCT 43.6 39.0 - 52.0 %   MCV 87.6 80.0 - 100.0 fL   MCH 30.1 26.0 - 34.0 pg   MCHC 34.4 30.0 - 36.0 g/dL   RDW 13.2 11.5 - 15.5 %   Platelets 248 150 - 400 K/uL   nRBC 0.0 0.0 - 0.2 %   Neutrophils Relative % 48 %   Neutro Abs 3.7 1.7 - 7.7 K/uL   Lymphocytes Relative 39 %   Lymphs Abs 3.1 0.7 - 4.0 K/uL   Monocytes Relative 7 %   Monocytes Absolute 0.6 0.1 - 1.0 K/uL   Eosinophils Relative 5 %   Eosinophils Absolute 0.4 0.0 - 0.5 K/uL   Basophils Relative 1 %   Basophils Absolute 0.1 0.0 - 0.1 K/uL  Immature Granulocytes 0 %   Abs Immature Granulocytes 0.02 0.00 - 0.07 K/uL    Comment: Performed at [facility], [address] [place], [place] [address]  Comprehensive metabolic panel     Status: Abnormal   Collection Time: 02/27/19  8:17 PM  Result Value Ref Range   Sodium 139 135 - 145 mmol/L   Potassium 3.6 3.5 - 5.1 mmol/L   Chloride 103 98 - 111 mmol/L   CO2 23 22 - 32 mmol/L   Glucose, Bld 106 (H) 70 - 99 mg/dL   BUN 13 6 - 20 mg/dL   Creatinine, Ser 6.041.29 (H) 0.61 - 1.24 mg/dL   Calcium 9.0 8.9 - 54.010.3 mg/dL   Total Protein 7.8 6.5 - 8.1 g/dL   Albumin 4.5 3.5 - 5.0 g/dL   AST 30 15 - 41 U/L   ALT 40 0 - 44 U/L   Alkaline Phosphatase 90 38 - 126 U/L   Total Bilirubin 0.7 0.3 - 1.2 mg/dL   GFR calc non Af Amer >60 >60 mL/min   GFR calc Af Amer >60 >60 mL/min   Anion  gap 13 5 - 15    Comment: Performed at [facility], [address] [place], [place] [address]  Troponin I (High Sensitivity)     Status: None   Collection Time: 02/27/19  8:17 PM  Result Value Ref Range   Troponin I (High Sensitivity) 7 <18 ng/L    Comment: (NOTE) Elevated high sensitivity troponin I (hsTnI) values and significant  changes across serial measurements may suggest ACS but many other  chronic and acute conditions are known to elevate hsTnI results.  Refer to the "Links" section for chest pain algorithms and additional  guidance. Performed at [facility], [address] [place], [place] [address]   Troponin I (High Sensitivity)     Status: None   Collection Time: 02/28/19 12:26 AM  Result Value Ref Range   Troponin I (High Sensitivity) 8 <18 ng/L    Comment: (NOTE) Elevated high sensitivity troponin I (hsTnI) values and significant  changes across serial measurements may suggest ACS but many other  chronic and acute conditions are known to elevate hsTnI results.  Refer to the "Links" section for chest pain algorithms and additional  guidance. Performed at [facility], [address] [place], [place] [address]   SARS Coronavirus 2 [facility](Hospital order, Performed in [facility] hospital lab) Nasopharyngeal Nasopharyngeal Swab     Status: None   Collection Time: 02/28/19 12:26 AM   Specimen: Nasopharyngeal Swab  Result Value Ref Range   SARS Coronavirus 2 NEGATIVE NEGATIVE    Comment: (NOTE) If result is NEGATIVE SARS-CoV-2 target nucleic acids are NOT DETECTED. The SARS-CoV-2 RNA is generally detectable in upper and lower  respiratory specimens during the acute phase of infection. The lowest  concentration of SARS-CoV-2 viral copies this assay can detect is 250  copies / mL. A negative result does not preclude SARS-CoV-2 infection  and should not be used as the sole basis for treatment or other  patient management decisions.  A  negative result may occur with  improper specimen collection / handling, submission of specimen other  than nasopharyngeal swab, presence of viral mutation(s) within the  areas targeted by this assay, and inadequate number of viral copies  (<250 copies / mL). A negative result must be combined with clinical  observations, patient history, and epidemiological information. If result is POSITIVE SARS-CoV-2 target nucleic acids are  DETECTED. The SARS-CoV-2 RNA is generally detectable in upper and lower  respiratory specimens dur ing the acute phase of infection.  Positive  results are indicative of active infection with SARS-CoV-2.  Clinical  correlation with patient history and other diagnostic information is  necessary to determine patient infection status.  Positive results do  not rule out bacterial infection or co-infection with other viruses. If result is PRESUMPTIVE POSTIVE SARS-CoV-2 nucleic acids MAY BE PRESENT.   A presumptive positive result was obtained on the submitted specimen  and confirmed on repeat testing.  While 2019 novel coronavirus  (SARS-CoV-2) nucleic acids may be present in the submitted sample  additional confirmatory testing may be necessary for epidemiological  and / or clinical management purposes  to differentiate between  SARS-CoV-2 and other Sarbecovirus currently known to infect humans.  If clinically indicated additional testing with an alternate test  methodology [id number]) is advised. The SARS-CoV-2 RNA is generally  detectable in upper and lower respiratory sp ecimens during the acute  phase of infection. The expected result is Negative. Fact Sheet for Patients:  BoilerBrush.com.cy Fact Sheet for Healthcare Providers: https://pope.com/ This test is not yet approved or cleared by the [place] FDA and has been authorized for detection and/or diagnosis of SARS-CoV-2 by FDA under an Emergency Use  Authorization (EUA).  This EUA will remain in effect (meaning this test can be used) for the duration of the COVID-19 declaration under Section 564(b)(1) of the Act, 21 U.S.C. section 360bbb-3(b)(1), unless the authorization is terminated or revoked sooner. Performed at [facility], [address] [place], [place] [address]    Dg Chest 2 View  Result Date: 02/27/2019 CLINICAL DATA:  Chest pain EXAM: CHEST - 2 VIEW COMPARISON:  None. FINDINGS: The heart size and mediastinal contours are within normal limits. Both lungs are clear. The visualized skeletal structures are unremarkable. IMPRESSION: No active cardiopulmonary disease. Electronically Signed   By: Jasmine Pang M.D.   On: 02/27/2019 20:34    Pending Labs Unresulted Labs (From admission, onward)    Start     Ordered   Signed and Held  HIV Antibody (routine testing w rflx)  (HIV Antibody (Routine testing w reflex) panel)  Once,   R     Signed and Held   Signed and Held  HIV4GL Save Tube  (HIV Antibody (Routine testing w reflex) panel)  Once,   R     Signed and Held   Signed and Held  Creatinine, serum  (enoxaparin (LOVENOX)    CrCl >/= 30 ml/min)  Weekly,   R    Comments: while on enoxaparin therapy    Signed and Held   Signed and Held  TSH  Add-on,   R     Signed and Held   Signed and Held  Hemoglobin A1c  Add-on,   R     Signed and Held          Vitals/Pain Today's Vitals   02/27/19 2014 02/27/19 2016 02/28/19 0000  BP:  136/86 (!) 136/99  Pulse:  100 100  Resp:  18 19  Temp:  97.8 F (36.6 C)   TempSrc:  Oral   SpO2:  98% 97%  Weight: 97.5 kg    Height: 6\' 3"  (1.905 m)    PainSc: 0-No pain      Isolation Precautions No active isolations  Medications Medications  aspirin chewable tablet 324 mg (324 mg Oral Given 02/28/19 0020)    Mobility walks Low fall  risk   Focused Assessments Cardiac Assessment Handoff:    No results found for: CKTOTAL, CKMB, CKMBINDEX, TROPONINI No results found  for: DDIMER Does the Patient currently have chest pain? No      R Recommendations: See Admitting Provider Note  Report given to:   Additional Notes:

## 2019-02-28 NOTE — Telephone Encounter (Signed)
Patient was seen in consult by Dr. Darnell Level, PA today. To scheduling to arrange a post hospital follow up in the next 2 weeks.

## 2019-02-28 NOTE — Consult Note (Signed)
NAME: Dylan Shields  DOB: [birth year]  MRN: [id number]  Date/Time: 02/28/2019 3:22 PM  REQUESTING PROVIDER: Dr. Nemiah CommanderKalisetti Subjective:  REASON FOR CONSULT: + HIV test ? Dylan Shields is a 43 y.o. male with a history of cardiac catheterization in the past, GERD, PTSD admitted to the hospital with chest pain across the chest radiating to his neck and arms and waking up from sleep the past few weeks. Patient says she has been having shortness of breath for the past year especially with minimal exertion.  There is a remote history of him having cardiac catheterization in the past and being told there was 30% blockage.  During this hospitalization his routine labs showed a positive HIV fourth-generation test.  I am asked to see the patient for the same. Patient denies any fever, night sweats, headache, weight loss, yeast infection in his mouth, abdominal pain or diarrhea. He is in a monogamous relationship for the past 19 years. He denies IV drug use or blood transfusion.  He is in a heterosexual relationship.  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 Work-up in the hospital for coronary artery disease that show negative troponins negative EKG negative stress test. Past Medical History:  Diagnosis Date  . GERD (gastroesophageal reflux disease)   . Post traumatic stress disorder (PTSD)     Past Surgical History:  Procedure Laterality Date  . CARDIAC CATHETERIZATION  2005   clear  Social history Married Non-smoker but uses snuff  Family History  Problem Relation Age of Onset  . Diabetes Father   . Hyperlipidemia Father   . Hypertension Father   . Heart disease Father        a.  CABG around age 44-60   Allergies  Allergen Reactions  . Penicillin G Other (See Comments)    Reaction: unknown Did it involve swelling of the face/tongue/throat, SOB, or low BP? Unknown Did it involve sudden or severe rash/hives, skin peeling, or any reaction on the inside of  your mouth or nose? Unknown Did you need to seek medical attention at a hospital or doctor's office? Unknown When did it last happen?During infancy If all above answers are "NO", may proceed with cephalosporin use.      ? Current Facility-Administered Medications  Medication Dose Route Frequency Provider Last Rate Last Dose  . acetaminophen (TYLENOL) tablet 650 mg  650 mg Oral Q6H PRN Arnaldo Nataliamond, Michael S, MD       Or  . acetaminophen (TYLENOL) suppository 650 mg  650 mg Rectal Q6H PRN Arnaldo Nataliamond, Michael S, MD      . amLODipine (NORVASC) tablet 5 mg  5 mg Oral Daily Enid BaasKalisetti, Radhika, MD      . Melene Muller[START ON 03/01/2019] aspirin chewable tablet 81 mg  81 mg Oral Daily Arnaldo Nataliamond, Michael S, MD      . atorvastatin (LIPITOR) tablet 40 mg  40 mg Oral q1800 Enid BaasKalisetti, Radhika, MD      . docusate sodium (COLACE) capsule 100 mg  100 mg Oral BID Arnaldo Nataliamond, Michael S, MD      . enoxaparin (LOVENOX) injection 40 mg  40 mg Subcutaneous Q24H Arnaldo Nataliamond, Michael S, MD      . ondansetron [facility](ZOFRAN) tablet 4 mg  4 mg Oral Q6H PRN Arnaldo Nataliamond, Michael S, MD       Or  . ondansetron [facility](ZOFRAN) injection 4 mg  4 mg Intravenous Q6H PRN Arnaldo Nataliamond, Michael S, MD      . pantoprazole (PROTONIX) EC tablet  40 mg  40 mg Oral BID Harrie Foreman, MD      . sertraline (ZOLOFT) tablet 150 mg  150 mg Oral Daily Harrie Foreman, MD         Abtx:  Anti-infectives (From admission, onward)   None      REVIEW OF SYSTEMS:  Const: negative fever, negative chills, negative weight loss Eyes: negative diplopia or visual changes, negative eye pain ENT: negative coryza, negative sore throat Resp: negative cough, hemoptysis, dyspnea Cards: As above GU: negative for frequency, dysuria and hematuria GI: Negative for abdominal pain, diarrhea, bleeding, constipation Skin: negative for rash and pruritus Heme: negative for easy bruising and gum/nose bleeding MS: negative for myalgias, arthralgias, back pain and muscle  weakness Neurolo:negative for headaches, dizziness, vertigo, memory problems  Psych: negative for feelings of anxiety, depression  Endocrine: negative for thyroid, diabetes Allergy/Immunology-allergic to penicillin Objective:  VITALS:  BP (!) 142/98 (BP Location: Right Arm)   Pulse 91   Temp 98 F (36.7 C) (Oral)   Resp 19   Ht 6\' 4"  (1.93 m)   Wt 108.5 kg   SpO2 98%   BMI 29.13 kg/m  PHYSICAL EXAM:  General: Alert, cooperative, no distress, appears stated age.  Head: Normocephalic, without obvious abnormality, atraumatic. Eyes: Conjunctivae clear, anicteric sclerae. Pupils are equal ENT Nares normal. No drainage or sinus tenderness. Lips, mucosa, and tongue normal. No Thrush Neck: Supple, symmetrical, no adenopathy, thyroid: non tender no carotid bruit and no JVD. Back: No CVA tenderness. Lungs: Clear to auscultation bilaterally. No Wheezing or Rhonchi. No rales. Heart: Regular rate and rhythm, no murmur, rub or gallop. Abdomen: Soft, non-tender,not distended. Bowel sounds normal. No masses Extremities: atraumatic, no cyanosis. No edema. No clubbing Skin: No rashes or lesions. Or bruising Lymph: Cervical, supraclavicular normal. Neurologic: Grossly non-focal Pertinent Labs Lab Results CBC    Component Value Date/Time   WBC 7.8 02/27/2019 2017   RBC 4.98 02/27/2019 2017   HGB 15.0 02/27/2019 2017   HCT 43.6 02/27/2019 2017   PLT 248 02/27/2019 2017   MCV 87.6 02/27/2019 2017   MCH 30.1 02/27/2019 2017   MCHC 34.4 02/27/2019 2017   RDW 13.2 02/27/2019 2017   LYMPHSABS 3.1 02/27/2019 2017   MONOABS 0.6 02/27/2019 2017   EOSABS 0.4 02/27/2019 2017   BASOSABS 0.1 02/27/2019 2017    CMP Latest Ref Rng & Units 02/27/2019 08/01/2016  Glucose 70 - 99 mg/dL 106(H) 91  BUN 6 - 20 mg/dL 13 13  Creatinine 0.61 - 1.24 mg/dL 1.29(H) 0.85  Sodium 135 - 145 mmol/L 139 141  Potassium 3.5 - 5.1 mmol/L 3.6 3.9  Chloride 98 - 111 mmol/L 103 110  CO2 22 - 32 mmol/L 23 22   Calcium 8.9 - 10.3 mg/dL 9.0 9.0  Total Protein 6.5 - 8.1 g/dL 7.8 7.6  Total Bilirubin 0.3 - 1.2 mg/dL 0.7 0.6  Alkaline Phos 38 - 126 U/L 90 73  AST 15 - 41 U/L 30 25  ALT 0 - 44 U/L 40 18      Microbiology: Recent Results (from the past 240 hour(s))  SARS Coronavirus 2 [facility] order, Performed in [facility] hospital lab) Nasopharyngeal Nasopharyngeal Swab     Status: None   Collection Time: 02/28/19 12:26 AM   Specimen: Nasopharyngeal Swab  Result Value Ref Range Status   SARS Coronavirus 2 NEGATIVE NEGATIVE Final    Comment: (NOTE) If result is NEGATIVE SARS-CoV-2 target nucleic acids are NOT DETECTED. The SARS-CoV-2 RNA is  generally detectable in upper and lower  respiratory specimens during the acute phase of infection. The lowest  concentration of SARS-CoV-2 viral copies this assay can detect is 250  copies / mL. A negative result does not preclude SARS-CoV-2 infection  and should not be used as the sole basis for treatment or other  patient management decisions.  A negative result may occur with  improper specimen collection / handling, submission of specimen other  than nasopharyngeal swab, presence of viral mutation(s) within the  areas targeted by this assay, and inadequate number of viral copies  (<250 copies / mL). A negative result must be combined with clinical  observations, patient history, and epidemiological information. If result is POSITIVE SARS-CoV-2 target nucleic acids are DETECTED. The SARS-CoV-2 RNA is generally detectable in upper and lower  respiratory specimens dur ing the acute phase of infection.  Positive  results are indicative of active infection with SARS-CoV-2.  Clinical  correlation with patient history and other diagnostic information is  necessary to determine patient infection status.  Positive results do  not rule out bacterial infection or co-infection with other viruses. If result is PRESUMPTIVE POSTIVE SARS-CoV-2 nucleic acids  MAY BE PRESENT.   A presumptive positive result was obtained on the submitted specimen  and confirmed on repeat testing.  While 2019 novel coronavirus  (SARS-CoV-2) nucleic acids may be present in the submitted sample  additional confirmatory testing may be necessary for epidemiological  and / or clinical management purposes  to differentiate between  SARS-CoV-2 and other Sarbecovirus currently known to infect humans.  If clinically indicated additional testing with an alternate test  methodology [phone]) is advised. The SARS-CoV-2 RNA is generally  detectable in upper and lower respiratory sp ecimens during the acute  phase of infection. The expected result is Negative. Fact Sheet for Patients:  BoilerBrush.com.cy Fact Sheet for Healthcare Providers: https://pope.com/ This test is not yet approved or cleared by the [place] FDA and has been authorized for detection and/or diagnosis of SARS-CoV-2 by FDA under an Emergency Use Authorization (EUA).  This EUA will remain in effect (meaning this test can be used) for the duration of the COVID-19 declaration under Section 564(b)(1) of the Act, 21 U.S.C. section 360bbb-3(b)(1), unless the authorization is terminated or revoked sooner. Performed at [facility], [address] [place], [place] [address]     IMAGING RESULTS: Echo normal Chest x-ray normal Nuclear medicine stress test normal I have personally reviewed the films ? Impression/Recommendation ? ?Chest pain.  Worked up and is being followed by cardiology  HIV fourth-generation test is reactive. We will send for confirmation test with HIV RNA ? Explained to the patient the preliminary HIV test was positive and we are confirming it with HIV RNA and antibiotics. ___Patient is getting discharged today but he will follow-up with me as outpatient to get the test results.  And if needed be engaged in care. Patient  would like to tell his wife only after the confirmatory test result is available.  _  Discussed with patient, in detail.   Note:  This document was prepared using Dragon voice recognition software and may include unintentional dictation errors.

## 2019-03-01 LAB — HIV-1 RNA QUANT-NO REFLEX-BLD
HIV 1 RNA Quant: <20 copies/mL
LOG10 HIV-1 RNA: UNDETERMINED log10copy/mL

## 2019-03-01 NOTE — Discharge Summary (Signed)
[facility] -  at [facility]   PATIENT NAME: Dylan Shields    MR#:  [id number]  DATE OF BIRTH:  Aug 09, 1975  DATE OF ADMISSION:  02/27/2019   ADMITTING PHYSICIAN: Arnaldo Natal, MD  DATE OF DISCHARGE: 02/28/2019  6:06 PM  PRIMARY CARE PHYSICIAN: Derwood Kaplan, MD   ADMISSION DIAGNOSIS:   Chest pain, unspecified type [R07.9]  DISCHARGE DIAGNOSIS:   Active Problems:   Chest pain   SECONDARY DIAGNOSIS:   Past Medical History:  Diagnosis Date  . GERD (gastroesophageal reflux disease)   . Post traumatic stress disorder (PTSD)     HOSPITAL COURSE:   43 year old male with past medical history significant for history of smoking, prior CAD, PTSD and GERD presents to hospital secondary to chest pain.  1.  Chest pain-concern for possible reflux disease with esophageal spasms versus musculoskeletal pain.  Atypical in presentation for angina. -Troponins are negative.  Currently chest pain-free -Appreciate cardiology consult.  Status post Myoview which was a low risk study.  No active ischemia noted. -Discharging patient on Protonix. -Outpatient cardiology follow-up recommended  2.  Hyperlipidemia-elevated LDL and triglycerides.  Started on statin.  3.  Hypertension-started on Norvasc  4.  PTSD-on Zoloft  5.    HIV prelim test came back as reactive.  Infectious disease has seen the patient in the hospital.  He denies any risky behavior.  He is heterosexual and has been in a monogamous relationship.  Denies any IVDA or blood transfusions. -Confirmatory test has been sent out.  Patient will follow-up with ID regarding test results and further care if needed.  He would like not to tell his spouse until confirmation 3 tests are resulted.  Patient is independent at baseline He is being discharged today  DISCHARGE CONDITIONS:   Guarded  CONSULTS OBTAINED:   Treatment Team:  Yvonne Kendall, MD  DRUG ALLERGIES:   Allergies  Allergen  Reactions  . Penicillin G Other (See Comments)    Reaction: unknown Did it involve swelling of the face/tongue/throat, SOB, or low BP? Unknown Did it involve sudden or severe rash/hives, skin peeling, or any reaction on the inside of your mouth or nose? Unknown Did you need to seek medical attention at a hospital or doctor's office? Unknown When did it last happen?During infancy If all above answers are "NO", may proceed with cephalosporin use.     DISCHARGE MEDICATIONS:   Allergies as of 02/28/2019      Reactions   Penicillin G Other (See Comments)   Reaction: unknown Did it involve swelling of the face/tongue/throat, SOB, or low BP? Unknown Did it involve sudden or severe rash/hives, skin peeling, or any reaction on the inside of your mouth or nose? Unknown Did you need to seek medical attention at a hospital or doctor's office? Unknown When did it last happen?During infancy If all above answers are "NO", may proceed with cephalosporin use.      Medication List    TAKE these medications   amLODipine 5 MG tablet Commonly known as: NORVASC Take 1 tablet (5 mg total) by mouth daily.   atorvastatin 40 MG tablet Commonly known as: LIPITOR Take 1 tablet (40 mg total) by mouth daily at 6 PM.   pantoprazole 40 MG tablet Commonly known as: PROTONIX Take 1 tablet (40 mg total) by mouth 2 (two) times daily.   sertraline 100 MG tablet Commonly known as: ZOLOFT Take 150 mg by mouth daily.        DISCHARGE  INSTRUCTIONS:   1.  PCP follow-up in 1 to 2 weeks 2.  Cardiology follow-up in 2 to 3 weeks  DIET:   Cardiac diet  ACTIVITY:   Activity as tolerated  OXYGEN:   Home Oxygen: No.  Oxygen Delivery: room air  DISCHARGE LOCATION:   home   If you experience worsening of your admission symptoms, develop shortness of breath, life threatening emergency, suicidal or homicidal thoughts you must seek medical attention immediately by calling 911 or calling your MD  immediately  if symptoms less severe.  You Must read complete instructions/literature along with all the possible adverse reactions/side effects for all the Medicines you take and that have been prescribed to you. Take any new Medicines after you have completely understood and accpet all the possible adverse reactions/side effects.   Please note  You were cared for by a hospitalist during your hospital stay. If you have any questions about your discharge medications or the care you received while you were in the hospital after you are discharged, you can call the unit and asked to speak with the hospitalist on call if the hospitalist that took care of you is not available. Once you are discharged, your primary care physician will handle any further medical issues. Please note that NO REFILLS for any discharge medications will be authorized once you are discharged, as it is imperative that you return to your primary care physician (or establish a relationship with a primary care physician if you do not have one) for your aftercare needs so that they can reassess your need for medications and monitor your lab values.    On the day of Discharge:  VITAL SIGNS:   Blood pressure (!) 142/98, pulse 91, temperature 98 F (36.7 C), temperature source Oral, resp. rate 19, height 6\' 4"  (1.93 m), weight 108.5 kg, SpO2 98 %.  PHYSICAL EXAMINATION:    GENERAL:  43 y.o.-year-old patient lying in the bed with no acute distress.  EYES: Pupils equal, round, reactive to light and accommodation. No scleral icterus. Extraocular muscles intact.  HEENT: Head atraumatic, normocephalic. Oropharynx and nasopharynx clear.  NECK:  Supple, no jugular venous distention. No thyroid enlargement, no tenderness.  LUNGS: Normal breath sounds bilaterally, no wheezing, rales,rhonchi or crepitation. No use of accessory muscles of respiration.  CARDIOVASCULAR: S1, S2 normal. No murmurs, rubs, or gallops.  ABDOMEN: Soft, nontender,  nondistended. Bowel sounds present. No organomegaly or mass.  EXTREMITIES: No pedal edema, cyanosis, or clubbing.  NEUROLOGIC: Cranial nerves II through XII are intact. Muscle strength 5/5 in all extremities. Sensation intact. Gait not checked.  PSYCHIATRIC: The patient is alert and oriented x 3.  SKIN: No obvious rash, lesion, or ulcer.   DATA REVIEW:   CBC Recent Labs  Lab 02/27/19 2017  WBC 7.8  HGB 15.0  HCT 43.6  PLT 248    Chemistries  Recent Labs  Lab 02/27/19 2017  NA 139  K 3.6  CL 103  CO2 23  GLUCOSE 106*  BUN 13  CREATININE 1.29*  CALCIUM 9.0  AST 30  ALT 40  ALKPHOS 90  BILITOT 0.7     Microbiology Results  Results for orders placed or performed during the hospital encounter of 02/27/19  SARS Coronavirus 2 [facility](Hospital order, Performed in [facility] lab) Nasopharyngeal Nasopharyngeal Swab     Status: None   Collection Time: 02/28/19 12:26 AM   Specimen: Nasopharyngeal Swab  Result Value Ref Range Status   SARS Coronavirus 2 NEGATIVE NEGATIVE Final  Comment: (NOTE) If result is NEGATIVE SARS-CoV-2 target nucleic acids are NOT DETECTED. The SARS-CoV-2 RNA is generally detectable in upper and lower  respiratory specimens during the acute phase of infection. The lowest  concentration of SARS-CoV-2 viral copies this assay can detect is 250  copies / mL. A negative result does not preclude SARS-CoV-2 infection  and should not be used as the sole basis for treatment or other  patient management decisions.  A negative result may occur with  improper specimen collection / handling, submission of specimen other  than nasopharyngeal swab, presence of viral mutation(s) within the  areas targeted by this assay, and inadequate number of viral copies  (<250 copies / mL). A negative result must be combined with clinical  observations, patient history, and epidemiological information. If result is POSITIVE SARS-CoV-2 target nucleic acids are DETECTED.  The SARS-CoV-2 RNA is generally detectable in upper and lower  respiratory specimens dur ing the acute phase of infection.  Positive  results are indicative of active infection with SARS-CoV-2.  Clinical  correlation with patient history and other diagnostic information is  necessary to determine patient infection status.  Positive results do  not rule out bacterial infection or co-infection with other viruses. If result is PRESUMPTIVE POSTIVE SARS-CoV-2 nucleic acids MAY BE PRESENT.   A presumptive positive result was obtained on the submitted specimen  and confirmed on repeat testing.  While 2019 novel coronavirus  (SARS-CoV-2) nucleic acids may be present in the submitted sample  additional confirmatory testing may be necessary for epidemiological  and / or clinical management purposes  to differentiate between  SARS-CoV-2 and other Sarbecovirus currently known to infect humans.  If clinically indicated additional testing with an alternate test  methodology [phone]) is advised. The SARS-CoV-2 RNA is generally  detectable in upper and lower respiratory sp ecimens during the acute  phase of infection. The expected result is Negative. Fact Sheet for Patients:  StrictlyIdeas.no Fact Sheet for Healthcare Providers: BankingDealers.co.za This test is not yet approved or cleared by the [place] FDA and has been authorized for detection and/or diagnosis of SARS-CoV-2 by FDA under an Emergency Use Authorization (EUA).  This EUA will remain in effect (meaning this test can be used) for the duration of the COVID-19 declaration under Section 564(b)(1) of the Act, 21 U.S.C. section 360bbb-3(b)(1), unless the authorization is terminated or revoked sooner. Performed at [facility], [address] [place], [place] [address]     RADIOLOGY:  No results found.   Management plans discussed with the patient, family and they are in  agreement.  CODE STATUS:  Code Status History    Date Active Date Inactive Code Status Order ID Comments User Context   02/28/2019 0401 02/28/2019 2152 Full Code [id number]  Harrie Foreman, MD Inpatient   Advance Care Planning Activity      TOTAL TIME TAKING CARE OF THIS PATIENT: 38 minutes.    Gladstone Lighter M.D on 03/01/2019 at 2:04 PM  Between 7am to 6pm - Pager - [phone]  After 6pm go to [email or web address] - Proofreader  [facility]  Office  [phone]  CC: Primary care physician; Marden Noble, MD   Note: This dictation was prepared with Dragon dictation along with smaller phrase technology. Any transcriptional errors that result from this process are unintentional.

## 2019-03-03 LAB — MISC LABCORP TEST (SEND OUT): Labcorp test code: 83935

## 2019-03-05 ENCOUNTER — Telehealth: Payer: Self-pay | Admitting: Infectious Diseases

## 2019-03-05 NOTE — Telephone Encounter (Signed)
Informed patient that the HIV test done at [facility]- both HIV RNA and HIV 4th generation test were both negative and he does not have HIV.

## 2019-03-05 NOTE — Telephone Encounter (Signed)
lmov to schedule  °

## 2019-03-06 LAB — NM MYOCAR MULTI W/SPECT W/WALL MOTION / EF
LV dias vol: 56 mL (ref 62–150)
LV sys vol: 15 mL
Peak HR: 121 {beats}/min
Percent HR: 68 %
Rest HR: 85 {beats}/min
SDS: 0
SRS: 0
SSS: 0
TID: 1.03

## 2019-03-29 NOTE — Telephone Encounter (Signed)
lmov to schedule  °

## 2019-05-11 NOTE — Telephone Encounter (Signed)
Noted  

## 2019-05-11 NOTE — Telephone Encounter (Signed)
No ans no vm  Unable to contact via phone. Mailing letter and closing encounter.

## 2021-01-05 IMAGING — CR DG CHEST 2V
2 series · 2 of 2 positions shown · non-contrast
Comparison: None.

CLINICAL DATA: Chest pain

EXAM:
CHEST - 2 VIEW

[chest pa]
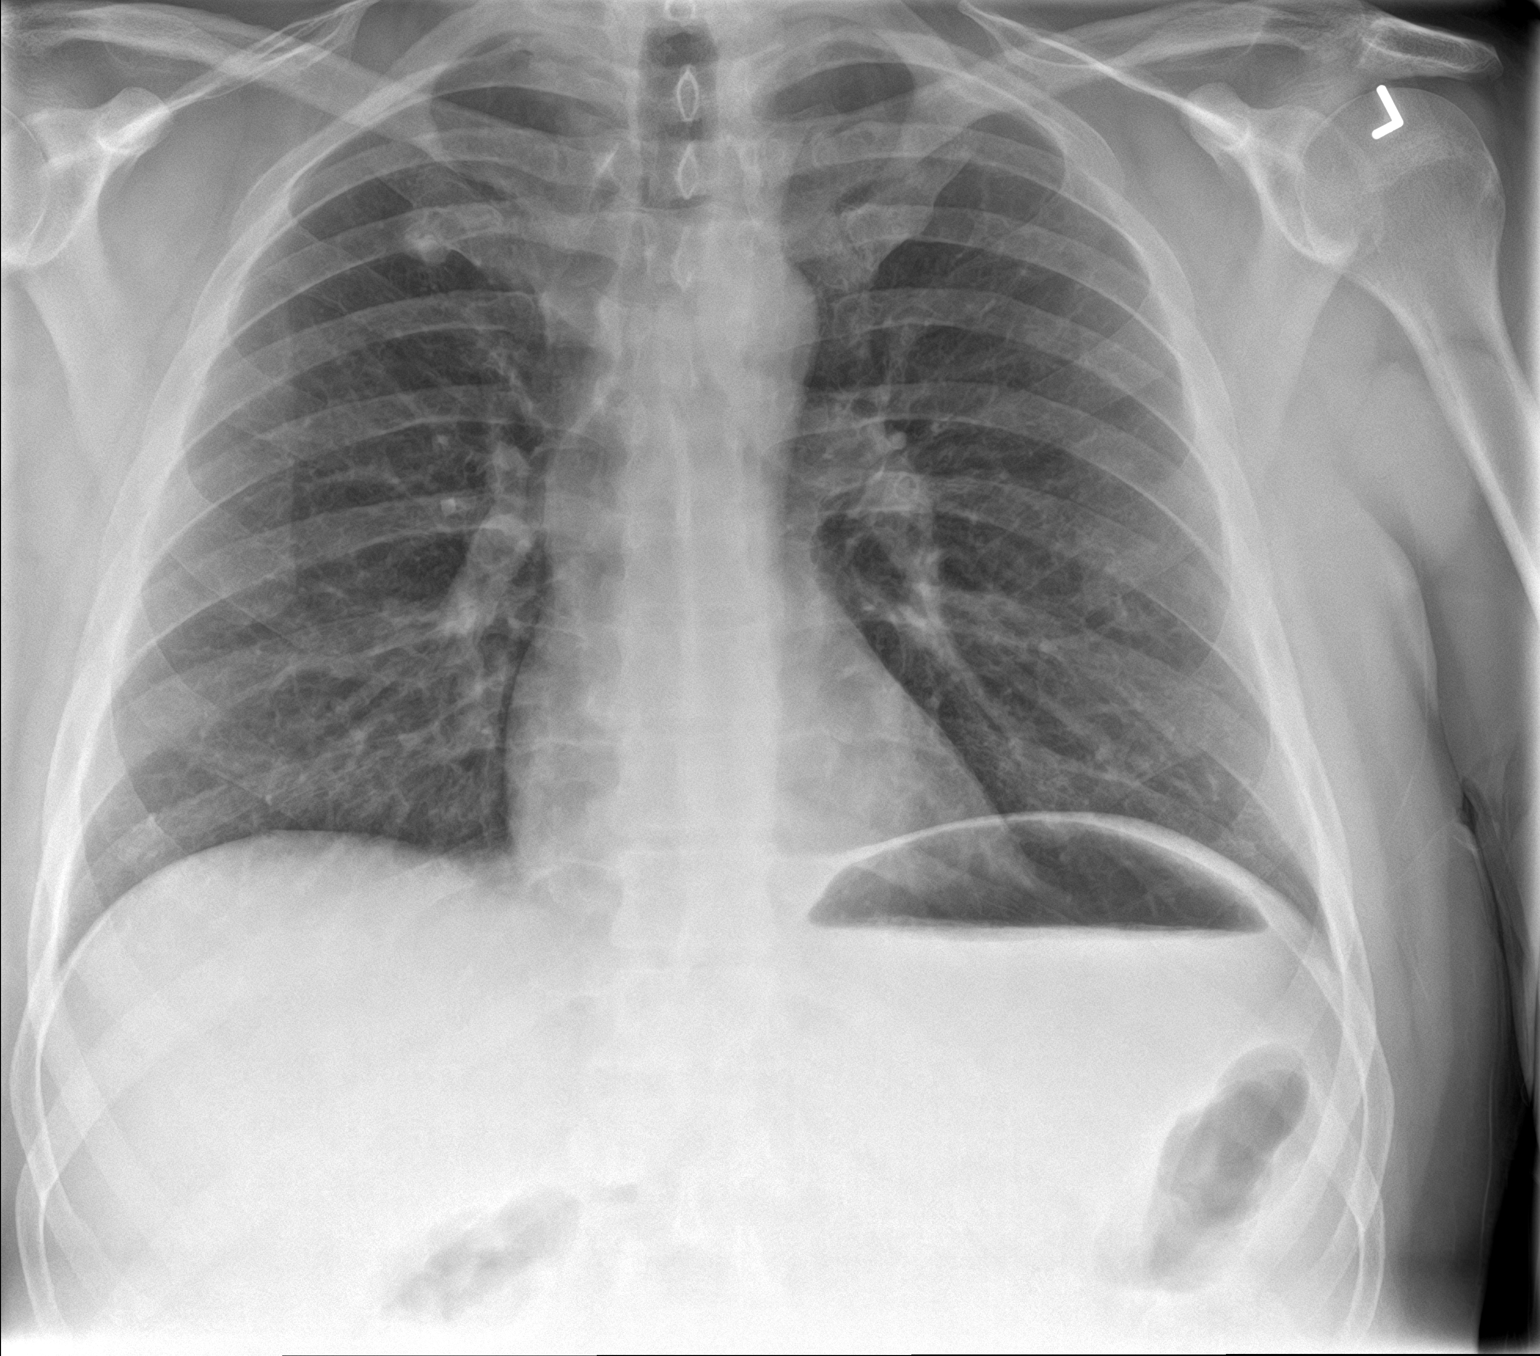

[chest lat]
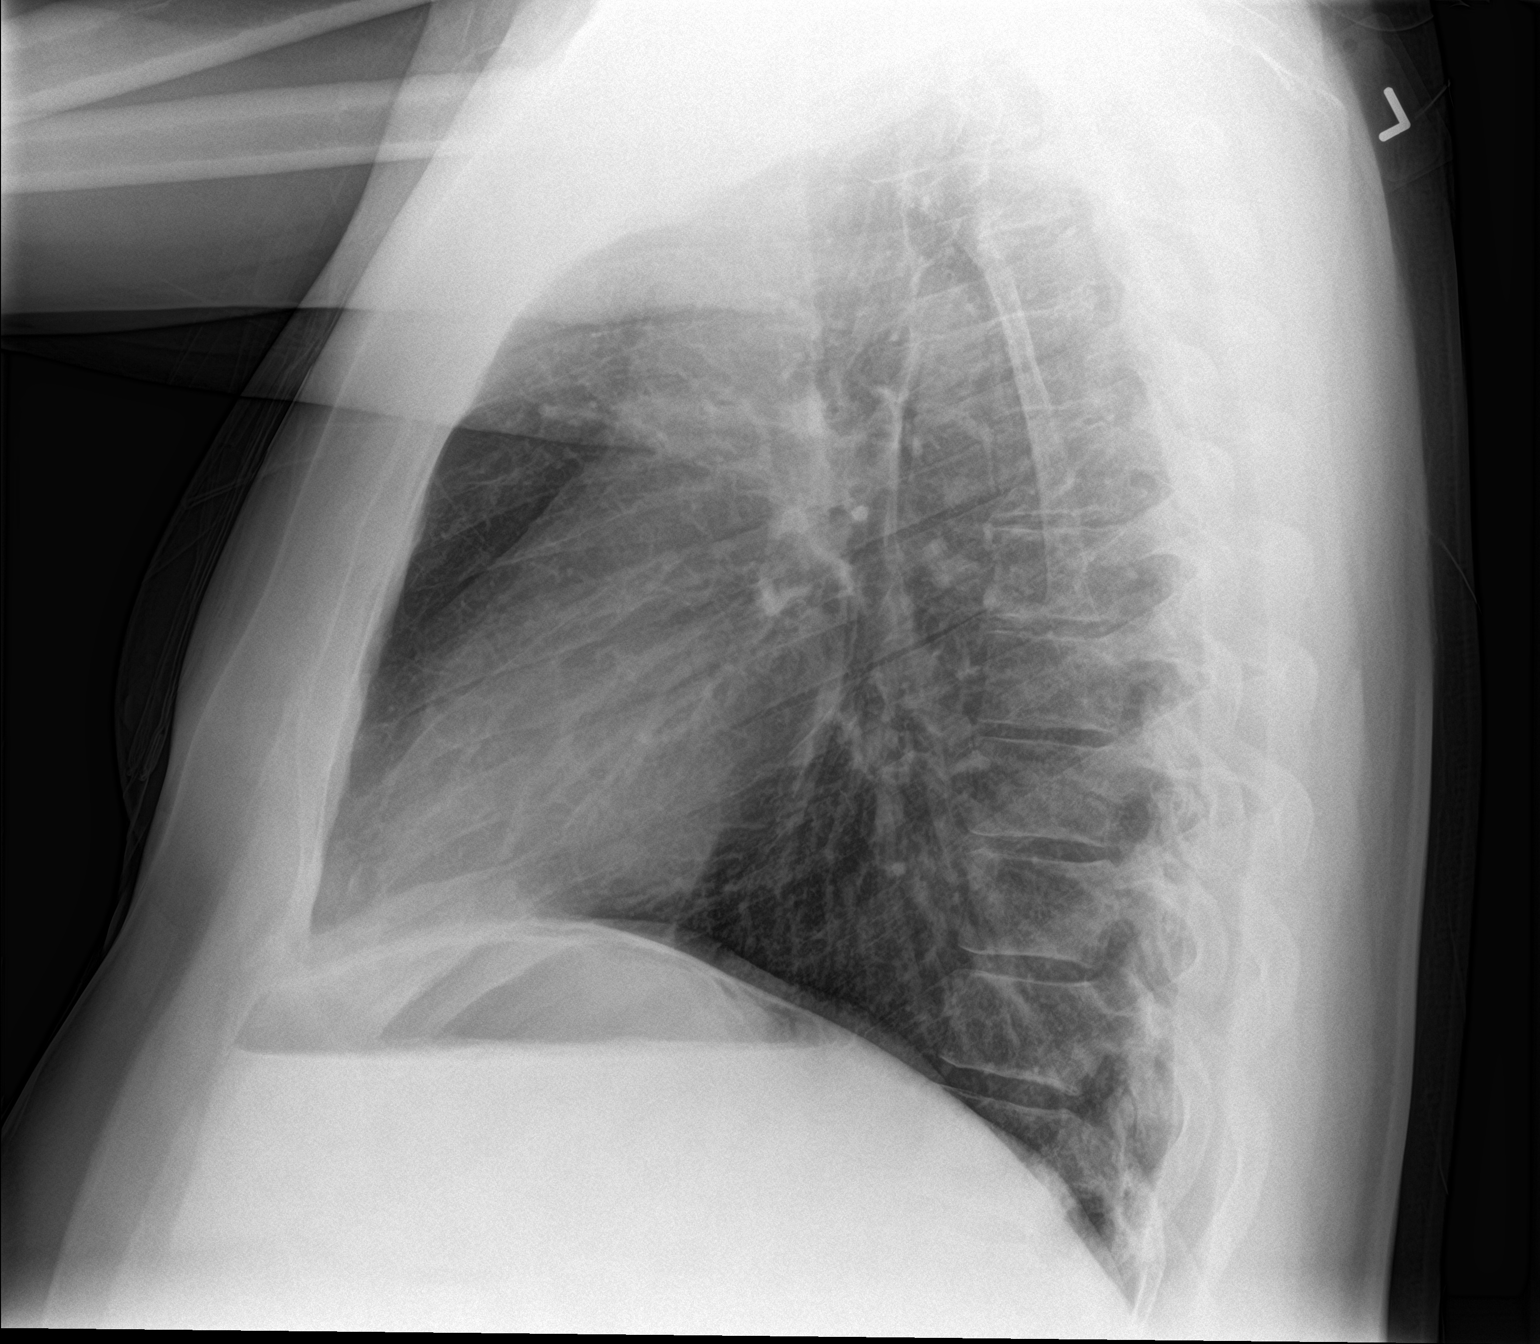

[2 of 2 positions shown; findings below may reference images not displayed]

FINDINGS: The heart size and mediastinal contours are within normal limits.
Both lungs are clear. The visualized skeletal structures are
unremarkable.
IMPRESSION: No active cardiopulmonary disease.
